# Patient Record
Sex: Female | Born: 1965
Health system: Southern US, Community
[De-identification: ages and names within clinical notes are randomized; demographics above are authoritative.]

## PROBLEM LIST (undated history)

## (undated) DIAGNOSIS — E7800 Pure hypercholesterolemia, unspecified: Secondary | ICD-10-CM

## (undated) DIAGNOSIS — T8859XA Other complications of anesthesia, initial encounter: Secondary | ICD-10-CM

## (undated) DIAGNOSIS — T4145XA Adverse effect of unspecified anesthetic, initial encounter: Secondary | ICD-10-CM

## (undated) HISTORY — PX: OTHER SURGICAL HISTORY: SHX169

---

## 2008-07-07 ENCOUNTER — Ambulatory Visit: Payer: Self-pay | Admitting: Occupational Medicine

## 2008-10-22 ENCOUNTER — Ambulatory Visit: Payer: Self-pay | Admitting: Internal Medicine

## 2008-11-07 ENCOUNTER — Ambulatory Visit: Payer: Self-pay | Admitting: Internal Medicine

## 2008-12-06 ENCOUNTER — Ambulatory Visit: Payer: Self-pay | Admitting: Internal Medicine

## 2009-09-19 ENCOUNTER — Ambulatory Visit: Payer: Self-pay | Admitting: Family Medicine

## 2009-09-19 DIAGNOSIS — J069 Acute upper respiratory infection, unspecified: Secondary | ICD-10-CM | POA: Insufficient documentation

## 2010-06-09 NOTE — Assessment & Plan Note (Signed)
Summary: POSSIBLE SINUS INFECTION   Vital Signs:  Patient Profile:   45 Years Old Female CC:      sinus pressure, ear pressure, hoarsness X 3 days Height:     68 inches Weight:      163 pounds O2 Sat:      98 % O2 treatment:    Room Air Temp:     98.8 degrees F oral Pulse rate:   88 / minute Pulse rhythm:   regular Resp:     14 per minute BP sitting:   121 / 71  (right arm) Cuff size:   regular  Pt. in pain?   no  Vitals Entered By: Lajean Saver RN (Sep 19, 2009 9:35 AM)                   Updated Prior Medication List: * BIRTH CONTROL once a day DAILY MULTIPLE VITAMINS  TABS (MULTIPLE VITAMIN) once daily  Current Allergies (reviewed today): ! * TETROCYCLINE ! SEPTRA ! CIPRO ! * RICOTTA CHEESE ! * VANILLAHistory of Present Illness Chief Complaint: sinus pressure, ear pressure, hoarsness X 3 days History of Present Illness: Subjective: Patient complains of sore throat 2 days ago, resolved.  She has had soreness in her ears for 4 days.  She has a history of seasonal allergies, taking Zyrtec D. No cough No pleuritic pain No wheezing + nasal congestion No post-nasal drainage No sinus pain/pressure No itchy/red eyes ? earache No hemoptysis No SOB No fever/chills No nausea No vomiting No abdominal pain No diarrhea No skin rashes + fatigue No myalgias + headache Used OTC meds without relief   REVIEW OF SYSTEMS Constitutional Symptoms      Denies fever, chills, night sweats, weight loss, weight gain, and fatigue.  Eyes       Denies change in vision, eye pain, eye discharge, glasses, contact lenses, and eye surgery. Ear/Nose/Throat/Mouth       Complains of ear pain, sinus problems, and hoarseness.      Denies hearing loss/aids, change in hearing, ear discharge, dizziness, frequent runny nose, frequent nose bleeds, sore throat, and tooth pain or bleeding.      Comments: bilateral ear pressure Respiratory       Denies dry cough, productive cough, wheezing,  shortness of breath, asthma, bronchitis, and emphysema/COPD.  Cardiovascular       Denies murmurs, chest pain, and tires easily with exhertion.    Gastrointestinal       Denies stomach pain, nausea/vomiting, diarrhea, constipation, blood in bowel movements, and indigestion. Genitourniary       Denies painful urination, kidney stones, and loss of urinary control. Neurological       Denies paralysis, seizures, and fainting/blackouts. Musculoskeletal       Denies muscle pain, joint pain, joint stiffness, decreased range of motion, redness, swelling, muscle weakness, and gout.  Skin       Denies bruising, unusual mles/lumps or sores, and hair/skin or nail changes.  Psych       Denies mood changes, temper/anger issues, anxiety/stress, speech problems, depression, and sleep problems. Other Comments: X 3 days. Ptient had sore throat  1 day, resolved   Past History:  Past Medical History: endometriosis  Past Surgical History: Carpal Boss excision 1995  Family History: Reviewed history from 07/07/2008 and no changes required. mother alive - hypertension, breast cancer, high cholesterol, gout, diabetes father alive - unknown  Social History: Reviewed history from 07/07/2008 and no changes required. denies smoking, drinking or recreational drug  use   Objective:  No acute distress  Eyes:  Pupils are equal, round, and reactive to light and accomdation.  Extraocular movement is intact.  Conjunctivae are not inflamed.  Ears:  Canals normal.  Tympanic membranes normal.   Nose:  Normal septum.  Normal turbinates, mildly congested.   No sinus tenderness present.  Pharynx:  Normal  Neck:  Supple.  No adenopathy is present.  No thyromegaly is present  Lungs:  Clear to auscultation.  Breath sounds are equal.  Heart:  Regular rate and rhythm without murmurs, rubs, or gallops.  Abdomen:  Nontender without masses or hepatosplenomegaly.  Bowel sounds are present.  No CVA or flank tenderness.    Skin:  No rash Assessment New Problems: URI (ICD-465.9)  SUSPECT EARLY VIRAL URI  Plan New Medications/Changes: PREDNISONE 10 MG TABS (PREDNISONE) 2 PO BID for 2 days, then 1 BID for 2 days, then 1 daily for 2 days.  Take PC  #14 x 0, 09/19/2009, Donna Christen MD  New Orders: Est. Patient Level III [phone] Planning Comments:   Begin tapering course of prednisone, expectorant/decongestant, cough suppressant at bedtime if cough worsens. Follow-up with PCP if not improving.   The patient and/or caregiver has been counseled thoroughly with regard to medications prescribed including dosage, schedule, interactions, rationale for use, and possible side effects and they verbalize understanding.  Diagnoses and expected course of recovery discussed and will return if not improved as expected or if the condition worsens. Patient and/or caregiver verbalized understanding.  Prescriptions: PREDNISONE 10 MG TABS (PREDNISONE) 2 PO BID for 2 days, then 1 BID for 2 days, then 1 daily for 2 days.  Take PC  #14 x 0   Entered and Authorized by:   Donna Christen MD   Signed by:   Donna Christen MD on 09/19/2009   Method used:   Print then Give to Patient   RxID:   [id number]   Patient Instructions: 1)  May use Mucinex D (guaifenesin with decongestant) twice daily for congestion. 2)  Increase fluid intake, rest. 3)  Stop Zyrtec D for now. 4)  May use Afrin nasal spray (or generic oxymetazoline) twice daily for about 5 days.  Also recommend using saline nasal spray several times daily and/or saline nasal irrigation. 5)  If cough develops, may take Delsym cough suppressant at bedtime. 6)  Followup with family doctor if not improving 7 to 10 days.

## 2010-11-19 ENCOUNTER — Other Ambulatory Visit (HOSPITAL_COMMUNITY): Payer: Self-pay | Admitting: Obstetrics and Gynecology

## 2010-11-19 DIAGNOSIS — D259 Leiomyoma of uterus, unspecified: Secondary | ICD-10-CM

## 2010-11-20 ENCOUNTER — Ambulatory Visit (HOSPITAL_COMMUNITY)
Admission: RE | Admit: 2010-11-20 | Discharge: 2010-11-20 | Disposition: A | Discharge status: Home / Self Care | Payer: 59 | Source: Ambulatory Visit | Attending: Obstetrics and Gynecology | Admitting: Obstetrics and Gynecology

## 2010-11-20 DIAGNOSIS — D259 Leiomyoma of uterus, unspecified: Secondary | ICD-10-CM | POA: Insufficient documentation

## 2010-11-20 DIAGNOSIS — R109 Unspecified abdominal pain: Secondary | ICD-10-CM | POA: Insufficient documentation

## 2010-11-20 MED ORDER — GADOBENATE DIMEGLUMINE 529 MG/ML IV SOLN
15.0000 mL | Freq: Once | INTRAVENOUS | Status: AC | PRN
  Administered 2010-11-20: 15 mL via INTRAVENOUS

## 2010-11-25 ENCOUNTER — Ambulatory Visit (INDEPENDENT_AMBULATORY_CARE_PROVIDER_SITE_OTHER): Payer: 59 | Admitting: Licensed Clinical Social Worker

## 2010-11-25 DIAGNOSIS — F4323 Adjustment disorder with mixed anxiety and depressed mood: Secondary | ICD-10-CM

## 2010-12-03 ENCOUNTER — Ambulatory Visit (INDEPENDENT_AMBULATORY_CARE_PROVIDER_SITE_OTHER): Payer: 59 | Admitting: Licensed Clinical Social Worker

## 2010-12-03 DIAGNOSIS — F4323 Adjustment disorder with mixed anxiety and depressed mood: Secondary | ICD-10-CM

## 2010-12-17 ENCOUNTER — Ambulatory Visit (INDEPENDENT_AMBULATORY_CARE_PROVIDER_SITE_OTHER): Payer: 59 | Admitting: Licensed Clinical Social Worker

## 2010-12-17 DIAGNOSIS — F4323 Adjustment disorder with mixed anxiety and depressed mood: Secondary | ICD-10-CM

## 2010-12-31 ENCOUNTER — Other Ambulatory Visit: Payer: Self-pay | Admitting: Obstetrics and Gynecology

## 2010-12-31 ENCOUNTER — Ambulatory Visit: Payer: 59 | Admitting: Licensed Clinical Social Worker

## 2011-01-05 ENCOUNTER — Other Ambulatory Visit (HOSPITAL_COMMUNITY): Payer: 59

## 2011-01-06 ENCOUNTER — Encounter (HOSPITAL_COMMUNITY)
Admission: RE | Admit: 2011-01-06 | Discharge: 2011-01-06 | Disposition: A | Discharge status: Home / Self Care | Payer: 59 | Source: Ambulatory Visit | Attending: Obstetrics and Gynecology | Admitting: Obstetrics and Gynecology

## 2011-01-06 ENCOUNTER — Encounter (HOSPITAL_COMMUNITY): Payer: Self-pay

## 2011-01-06 HISTORY — DX: Pure hypercholesterolemia, unspecified: E78.00

## 2011-01-06 HISTORY — DX: Other complications of anesthesia, initial encounter: T88.59XA

## 2011-01-06 HISTORY — DX: Adverse effect of unspecified anesthetic, initial encounter: T41.45XA

## 2011-01-06 LAB — CBC
HCT: 39.5 % (ref 36.0–46.0)
Hemoglobin: 13.3 g/dL (ref 12.0–15.0)
MCH: 30.5 pg (ref 26.0–34.0)
MCHC: 33.7 g/dL (ref 30.0–36.0)

## 2011-01-06 LAB — SURGICAL PCR SCREEN: MRSA, PCR: NEGATIVE

## 2011-01-06 NOTE — Patient Instructions (Addendum)
20 ADALIE MAND  01/06/2011   Your procedure is scheduled on:  01/13/11  Enter through the Main Entrance of [facility] at 600 AM.  Pick up the phone at the desk and dial [phone].   Call this number if you have problems the morning of surgery: [phone]   Remember:   Do not eat food:After Midnight.  Do not drink clear liquids: After Midnight.  Take these medicines the morning of surgery with A SIP OF WATER: NA   Do not wear jewelry, make-up or nail polish.  Do not wear lotions, powders, or perfumes. You may wear deodorant.  Do not shave 48 hours prior to surgery.  Do not bring valuables to the hospital.  Contacts, dentures or bridgework may not be worn into surgery.  Leave suitcase in the car. After surgery it may be brought to your room.  For patients admitted to the hospital, checkout time is 11:00 AM the day of discharge.   Patients discharged the day of surgery will not be allowed to drive home.  Name and phone number of your driver: NA  Special Instructions: CHG Shower Use Special Wash: 1/2 bottle night before surgery and 1/2 bottle morning of surgery.   Please read over the following fact sheets that you were given: MRSA Information and Care and Recovery After Surgery

## 2011-01-12 MED ORDER — GENTAMICIN SULFATE 40 MG/ML IJ SOLN
INTRAVENOUS | Status: AC
  Administered 2011-01-13: 100 mL via INTRAVENOUS
  Filled 2011-01-12: qty 2.5

## 2011-01-12 NOTE — H&P (Signed)
45 y.o. yo G0 complains of symptomatic fibroid uterus.  Pt has had prolonged and excessive bleeding, worsening over the last year.  OCPs have not completely helped the bleeding.  Pt wishes to retain fertility.  Past Medical History  Diagnosis Date  . Complication of anesthesia     :hard to wake"  . Elevated cholesterol    Past Surgical History  Procedure Date  . Laparoscopies     x2  . Carpel      carpel boss excision    History   Social History  . Marital Status: Single    Spouse Name: N/A    Number of Children: N/A  . Years of Education: N/A   Occupational History  . Not on file.   Social History Main Topics  . Smoking status: Never Smoker   . Smokeless tobacco: Never Used  . Alcohol Use: No  . Drug Use: No  . Sexually Active:    Other Topics Concern  . Not on file   Social History Narrative  . No narrative on file    No current facility-administered medications on file prior to encounter.   No current outpatient prescriptions on file prior to encounter.    Allergies  Allergen Reactions  . Augmentin (Amoxicillin-Pot Clavulanate) Diarrhea and Nausea And Vomiting  . Ciprofloxacin     REACTION: nausea/vomiting/diarrhea  . Tetracycline Hcl     REACTION: nausea/vomiting/diarrhea    @VITALS2 @  Lungs: clear to ascultation Cor:  RRR Abdomen:  soft, nontender, nondistended. Ex:  no cords, erythema Pelvic:  17 week size, bulky down to cervix.  U/S:  At least 5 fibroids ranging from to 6-7 cm.  EM obscured by posterior fibroid.  RO normal, LO not seen.  MRI:  Confirmed at least 5 major fibroids mapped out, biggest 8 cm.  No other pelvic abnormalities seen.  A few smaller fibroids seen.  A:  45 yo with enlarged symptomatic fibroid uterus.  Also history of endometriosis.     P:  For Robotic assisted myomectomy and possible resection of endometriosis or LOA.   All risks, benefits and alternatives d/w patient and she desires to proceed with procedures .   Patient has undergone a modified bowel prep and will receive preop antibiotics and SCDs during the operation.     Macie Baum A  Roseanna Koplin A

## 2011-01-13 ENCOUNTER — Encounter (HOSPITAL_COMMUNITY)
Admission: RE | Disposition: A | Discharge status: Home / Self Care | Payer: Self-pay | Source: Ambulatory Visit | Attending: Obstetrics and Gynecology

## 2011-01-13 ENCOUNTER — Encounter (HOSPITAL_COMMUNITY): Payer: Self-pay | Admitting: Anesthesiology

## 2011-01-13 ENCOUNTER — Other Ambulatory Visit: Payer: Self-pay | Admitting: Obstetrics and Gynecology

## 2011-01-13 ENCOUNTER — Ambulatory Visit (HOSPITAL_COMMUNITY): Payer: 59 | Admitting: Anesthesiology

## 2011-01-13 ENCOUNTER — Ambulatory Visit (HOSPITAL_COMMUNITY)
Admission: RE | Admit: 2011-01-13 | Discharge: 2011-01-14 | Disposition: A | Discharge status: Home / Self Care | Payer: 59 | Source: Ambulatory Visit | Attending: Obstetrics and Gynecology | Admitting: Obstetrics and Gynecology

## 2011-01-13 ENCOUNTER — Encounter (HOSPITAL_COMMUNITY): Payer: Self-pay | Admitting: *Deleted

## 2011-01-13 DIAGNOSIS — Z01812 Encounter for preprocedural laboratory examination: Secondary | ICD-10-CM | POA: Insufficient documentation

## 2011-01-13 DIAGNOSIS — Z01818 Encounter for other preprocedural examination: Secondary | ICD-10-CM | POA: Insufficient documentation

## 2011-01-13 DIAGNOSIS — Z9889 Other specified postprocedural states: Secondary | ICD-10-CM

## 2011-01-13 DIAGNOSIS — D259 Leiomyoma of uterus, unspecified: Secondary | ICD-10-CM | POA: Insufficient documentation

## 2011-01-13 HISTORY — PX: ROBOT ASSISTED MYOMECTOMY: SHX5142

## 2011-01-13 LAB — HEMOGLOBIN AND HEMATOCRIT, BLOOD: Hemoglobin: 11.7 g/dL — ABNORMAL LOW (ref 12.0–15.0)

## 2011-01-13 SURGERY — ROBOTIC ASSISTED MYOMECTOMY
Anesthesia: General | Wound class: Clean Contaminated

## 2011-01-13 MED ORDER — FENTANYL CITRATE 0.05 MG/ML IJ SOLN
INTRAMUSCULAR | Status: AC
  Filled 2011-01-13: qty 5

## 2011-01-13 MED ORDER — ONDANSETRON HCL 4 MG/2ML IJ SOLN
4.0000 mg | Freq: Four times a day (QID) | INTRAMUSCULAR | Status: DC | PRN
  Filled 2011-01-13: qty 2

## 2011-01-13 MED ORDER — FENTANYL CITRATE 0.05 MG/ML IJ SOLN
INTRAMUSCULAR | Status: AC
  Filled 2011-01-13: qty 2

## 2011-01-13 MED ORDER — HYDROMORPHONE 0.3 MG/ML IV SOLN
INTRAVENOUS | Status: DC
  Administered 2011-01-13: 2.85 mg via INTRAVENOUS
  Administered 2011-01-13: 20:00:00 via INTRAVENOUS
  Administered 2011-01-14: 1.8 mg via INTRAVENOUS
  Administered 2011-01-14: 05:00:00 via INTRAVENOUS
  Administered 2011-01-14: 1.89 via INTRAVENOUS

## 2011-01-13 MED ORDER — PROMETHAZINE HCL 25 MG/ML IJ SOLN
12.5000 mg | Freq: Four times a day (QID) | INTRAMUSCULAR | Status: DC | PRN
  Administered 2011-01-13 – 2011-01-14 (×2): 12.5 mg via INTRAVENOUS
  Filled 2011-01-13 (×2): qty 1

## 2011-01-13 MED ORDER — LIDOCAINE HCL (CARDIAC) 20 MG/ML IV SOLN
INTRAVENOUS | Status: DC | PRN
Start: 2011-01-13 — End: 2011-01-13
  Administered 2011-01-13: 50 mg via INTRAVENOUS

## 2011-01-13 MED ORDER — FENTANYL CITRATE 0.05 MG/ML IJ SOLN
INTRAMUSCULAR | Status: DC | PRN
  Administered 2011-01-13: 50 ug via INTRAVENOUS
  Administered 2011-01-13: 100 ug via INTRAVENOUS
  Administered 2011-01-13: 150 ug via INTRAVENOUS
  Administered 2011-01-13: 200 ug via INTRAVENOUS
  Administered 2011-01-13 (×2): 100 ug via INTRAVENOUS

## 2011-01-13 MED ORDER — SIMVASTATIN 20 MG PO TABS
20.0000 mg | ORAL_TABLET | Freq: Every day | ORAL | Status: DC
  Filled 2011-01-13 (×2): qty 1

## 2011-01-13 MED ORDER — MIDAZOLAM HCL 5 MG/5ML IJ SOLN
INTRAMUSCULAR | Status: DC | PRN
  Administered 2011-01-13: 2 mg via INTRAVENOUS

## 2011-01-13 MED ORDER — LACTATED RINGERS IV SOLN
INTRAVENOUS | Status: DC
  Administered 2011-01-13 (×3): via INTRAVENOUS

## 2011-01-13 MED ORDER — DEXTROSE IN LACTATED RINGERS 5 % IV SOLN
INTRAVENOUS | Status: DC
  Administered 2011-01-13 – 2011-01-14 (×3): via INTRAVENOUS

## 2011-01-13 MED ORDER — ROCURONIUM BROMIDE 50 MG/5ML IV SOLN
INTRAVENOUS | Status: AC
  Filled 2011-01-13: qty 1

## 2011-01-13 MED ORDER — KETOROLAC TROMETHAMINE 30 MG/ML IJ SOLN
INTRAMUSCULAR | Status: AC
  Administered 2011-01-13: 30 mg via INTRAVENOUS
  Filled 2011-01-13: qty 1

## 2011-01-13 MED ORDER — ONDANSETRON HCL 4 MG/2ML IJ SOLN
INTRAMUSCULAR | Status: DC | PRN
  Administered 2011-01-13: 4 mg via INTRAVENOUS

## 2011-01-13 MED ORDER — HYDROMORPHONE 0.3 MG/ML IV SOLN
INTRAVENOUS | Status: AC
  Filled 2011-01-13: qty 25

## 2011-01-13 MED ORDER — ONDANSETRON HCL 4 MG PO TABS: 4.0000 mg | ORAL_TABLET | Freq: Four times a day (QID) | ORAL | Status: DC | PRN

## 2011-01-13 MED ORDER — NALOXONE HCL 0.4 MG/ML IJ SOLN: 0.4000 mg | INTRAMUSCULAR | Status: DC | PRN

## 2011-01-13 MED ORDER — VASOPRESSIN 20 UNIT/ML IJ SOLN
INTRAMUSCULAR | Status: DC | PRN
  Administered 2011-01-13: 43 m[IU] via INTRAVENOUS

## 2011-01-13 MED ORDER — PROPOFOL 10 MG/ML IV EMUL
INTRAVENOUS | Status: DC | PRN
  Administered 2011-01-13: 180 mg via INTRAVENOUS

## 2011-01-13 MED ORDER — DIPHENHYDRAMINE HCL 12.5 MG/5ML PO ELIX: 12.5000 mg | ORAL_SOLUTION | Freq: Four times a day (QID) | ORAL | Status: DC | PRN

## 2011-01-13 MED ORDER — NEOSTIGMINE METHYLSULFATE 1 MG/ML IJ SOLN
INTRAMUSCULAR | Status: DC | PRN
  Administered 2011-01-13: 2 mg via INTRAMUSCULAR

## 2011-01-13 MED ORDER — MIDAZOLAM HCL 2 MG/2ML IJ SOLN
INTRAMUSCULAR | Status: AC
  Filled 2011-01-13: qty 2

## 2011-01-13 MED ORDER — SUMATRIPTAN SUCCINATE 50 MG PO TABS
50.0000 mg | ORAL_TABLET | ORAL | Status: DC | PRN
  Filled 2011-01-13: qty 1

## 2011-01-13 MED ORDER — HYDROMORPHONE HCL 1 MG/ML IJ SOLN
INTRAMUSCULAR | Status: AC
  Filled 2011-01-13: qty 1

## 2011-01-13 MED ORDER — KETOROLAC TROMETHAMINE 30 MG/ML IJ SOLN
30.0000 mg | Freq: Four times a day (QID) | INTRAMUSCULAR | Status: DC
  Administered 2011-01-13: 30 mg via INTRAVENOUS

## 2011-01-13 MED ORDER — OXYCODONE-ACETAMINOPHEN 5-325 MG PO TABS: 1.0000 | ORAL_TABLET | ORAL | Status: DC | PRN

## 2011-01-13 MED ORDER — ROCURONIUM BROMIDE 100 MG/10ML IV SOLN
INTRAVENOUS | Status: DC | PRN
  Administered 2011-01-13 (×2): 20 mg via INTRAVENOUS
  Administered 2011-01-13: 60 mg via INTRAVENOUS
  Administered 2011-01-13: 20 mg via INTRAVENOUS

## 2011-01-13 MED ORDER — DEXTROSE IN LACTATED RINGERS 5 % IV SOLN: INTRAVENOUS | Status: DC

## 2011-01-13 MED ORDER — SODIUM CHLORIDE 0.9 % IJ SOLN: 9.0000 mL | INTRAMUSCULAR | Status: DC | PRN

## 2011-01-13 MED ORDER — LACTATED RINGERS IR SOLN
Status: DC | PRN
  Administered 2011-01-13: 3

## 2011-01-13 MED ORDER — IBUPROFEN 800 MG PO TABS: 800.0000 mg | ORAL_TABLET | Freq: Three times a day (TID) | ORAL | Status: DC | PRN

## 2011-01-13 MED ORDER — KETOROLAC TROMETHAMINE 30 MG/ML IJ SOLN
30.0000 mg | Freq: Four times a day (QID) | INTRAMUSCULAR | Status: DC
  Administered 2011-01-13 – 2011-01-14 (×3): 30 mg via INTRAVENOUS
  Filled 2011-01-13 (×3): qty 1

## 2011-01-13 MED ORDER — ONDANSETRON HCL 4 MG/2ML IJ SOLN
4.0000 mg | Freq: Four times a day (QID) | INTRAMUSCULAR | Status: DC | PRN
  Administered 2011-01-13 – 2011-01-14 (×2): 4 mg via INTRAVENOUS
  Filled 2011-01-13 (×2): qty 2

## 2011-01-13 MED ORDER — DIPHENHYDRAMINE HCL 50 MG/ML IJ SOLN: 12.5000 mg | Freq: Four times a day (QID) | INTRAMUSCULAR | Status: DC | PRN

## 2011-01-13 MED ORDER — HYDROMORPHONE HCL 1 MG/ML IJ SOLN
INTRAMUSCULAR | Status: AC
  Administered 2011-01-13: 0.5 mg via INTRAVENOUS
  Filled 2011-01-13: qty 1

## 2011-01-13 MED ORDER — DEXAMETHASONE SODIUM PHOSPHATE 4 MG/ML IJ SOLN
INTRAMUSCULAR | Status: DC | PRN
  Administered 2011-01-13: 10 mg via INTRAVENOUS

## 2011-01-13 MED ORDER — GLYCOPYRROLATE 0.2 MG/ML IJ SOLN
INTRAMUSCULAR | Status: DC | PRN
  Administered 2011-01-13: .4 mg via INTRAVENOUS

## 2011-01-13 MED ORDER — MENTHOL 3 MG MT LOZG
1.0000 | LOZENGE | OROMUCOSAL | Status: DC | PRN
Start: 2011-01-13 — End: 2011-01-14

## 2011-01-13 MED ORDER — LIDOCAINE HCL (CARDIAC) 20 MG/ML IV SOLN
INTRAVENOUS | Status: AC
  Filled 2011-01-13: qty 5

## 2011-01-13 MED ORDER — PROPOFOL 10 MG/ML IV EMUL
INTRAVENOUS | Status: AC
  Filled 2011-01-13: qty 20

## 2011-01-13 MED ORDER — ZOLPIDEM TARTRATE 5 MG PO TABS: 5.0000 mg | ORAL_TABLET | Freq: Every evening | ORAL | Status: DC | PRN

## 2011-01-13 MED ORDER — HYDROMORPHONE HCL 1 MG/ML IJ SOLN
INTRAMUSCULAR | Status: DC | PRN
  Administered 2011-01-13: 1 mg via INTRAVENOUS
  Administered 2011-01-13: 0.5 mg via INTRAVENOUS

## 2011-01-13 MED ORDER — RINGERS IRRIGATION IR SOLN
Status: DC | PRN
  Administered 2011-01-13: 500 mL

## 2011-01-13 SURGICAL SUPPLY — 67 items
APL SKNCLS STERI-STRIP NONHPOA (GAUZE/BANDAGES/DRESSINGS) ×1
BARRIER ADHS 3X4 INTERCEED (GAUZE/BANDAGES/DRESSINGS) ×2 IMPLANT
BENZOIN TINCTURE PRP APPL 2/3 (GAUZE/BANDAGES/DRESSINGS) ×2 IMPLANT
BLADELESS LONG 8MM (BLADE) ×2 IMPLANT
BRR ADH 4X3 ABS CNTRL BYND (GAUZE/BANDAGES/DRESSINGS) ×1
BRR ADH 6X5 SEPRAFILM 1 SHT (MISCELLANEOUS) ×2
CABLE HIGH FREQUENCY MONO STRZ (ELECTRODE) ×2 IMPLANT
CANISTER SUCTION 2500CC (MISCELLANEOUS) ×3 IMPLANT
CANNULA SEAL DVNC (CANNULA) ×3 IMPLANT
CANNULA SEALS DA VINCI (CANNULA) ×3
CHLORAPREP W/TINT 26ML (MISCELLANEOUS) ×2 IMPLANT
CLOTH BEACON ORANGE TIMEOUT ST (SAFETY) ×2 IMPLANT
CONT PATH 16OZ SNAP LID 3702 (MISCELLANEOUS) ×2 IMPLANT
CORDS BIPOLAR (ELECTRODE) ×1 IMPLANT
COVER MAYO STAND STRL (DRAPES) ×2 IMPLANT
COVER TABLE BACK 60X90 (DRAPES) ×4 IMPLANT
COVER TIP SHEARS 8 DVNC (MISCELLANEOUS) ×1 IMPLANT
COVER TIP SHEARS 8MM DA VINCI (MISCELLANEOUS) ×1
DECANTER SPIKE VIAL GLASS SM (MISCELLANEOUS) ×2 IMPLANT
DERMABOND ADVANCED (GAUZE/BANDAGES/DRESSINGS) ×2 IMPLANT
DRAPE HUG U DISPOSABLE (DRAPE) ×2 IMPLANT
DRAPE HYSTEROSCOPY (DRAPE) ×2 IMPLANT
DRAPE LG THREE QUARTER DISP (DRAPES) ×4 IMPLANT
DRAPE MONITOR DA VINCI (DRAPE) ×2 IMPLANT
DRAPE WARM FLUID 44X44 (DRAPE) ×2 IMPLANT
ELECT REM PT RETURN 9FT ADLT (ELECTROSURGICAL) ×2
ELECTRODE REM PT RTRN 9FT ADLT (ELECTROSURGICAL) ×1 IMPLANT
EVACUATOR SMOKE 8.L (FILTER) ×2 IMPLANT
GAUZE VASELINE 3X9 (GAUZE/BANDAGES/DRESSINGS) IMPLANT
GLOVE BIO SURGEON STRL SZ7 (GLOVE) ×6 IMPLANT
GLOVE ECLIPSE 6.5 STRL STRAW (GLOVE) ×6 IMPLANT
GOWN PREVENTION PLUS LG XLONG (DISPOSABLE) ×8 IMPLANT
GYRUS RUMI II 2.5CM BLUE (DISPOSABLE) ×2
KIT DISP ACCESSORY 4 ARM (KITS) ×2 IMPLANT
MANIPULATOR UTERINE 4.5 ZUMI (MISCELLANEOUS) IMPLANT
NDL INSUFFLATION 14GA 120MM (NEEDLE) ×1 IMPLANT
NEEDLE INSUFFLATION 14GA 120MM (NEEDLE) ×2 IMPLANT
NS IRRIG 1000ML POUR BTL (IV SOLUTION) ×6 IMPLANT
OCCLUDER COLPOPNEUMO (BALLOONS) ×4 IMPLANT
PACK LAVH (CUSTOM PROCEDURE TRAY) ×2 IMPLANT
POSITIONER SURGICAL ARM (MISCELLANEOUS) ×4 IMPLANT
RUMI II GYRUS 2.5CM BLUE (DISPOSABLE) IMPLANT
SEPRAFILM MEMBRANE 5X6 (MISCELLANEOUS) ×2 IMPLANT
SET IRRIG TUBING LAPAROSCOPIC (IRRIGATION / IRRIGATOR) ×3 IMPLANT
SOLUTION ELECTROLUBE (MISCELLANEOUS) ×2 IMPLANT
SPONGE LAP 18X18 X RAY DECT (DISPOSABLE) IMPLANT
STRIP CLOSURE SKIN 1/2X4 (GAUZE/BANDAGES/DRESSINGS) ×2 IMPLANT
SUT VIC AB 0 CT1 27 (SUTURE) ×10
SUT VIC AB 0 CT1 27XBRD ANBCTR (SUTURE) ×5 IMPLANT
SUT VIC AB 2-0 CT2 27 (SUTURE) ×4 IMPLANT
SUT VICRYL 0 UR6 27IN ABS (SUTURE) ×4 IMPLANT
SUT VICRYL RAPIDE 3 0 (SUTURE) ×4 IMPLANT
SYR 50ML LL SCALE MARK (SYRINGE) ×2 IMPLANT
SYSTEM CONVERTIBLE TROCAR (TROCAR) IMPLANT
TIP RUMI ORANGE 6.7MMX12CM (TIP) ×1 IMPLANT
TIP UTERINE 5.1X6CM LAV DISP (MISCELLANEOUS) IMPLANT
TIP UTERINE 6.7X10CM GRN DISP (MISCELLANEOUS) IMPLANT
TIP UTERINE 6.7X6CM WHT DISP (MISCELLANEOUS) IMPLANT
TIP UTERINE 6.7X8CM BLUE DISP (MISCELLANEOUS) IMPLANT
TOWEL OR 17X24 6PK STRL BLUE (TOWEL DISPOSABLE) ×4 IMPLANT
TRAY FOLEY BAG SILVER LF 14FR (CATHETERS) ×2 IMPLANT
TROCAR DISP BLADELESS 8 DVNC (TROCAR) ×1 IMPLANT
TROCAR DISP BLADELESS 8MM (TROCAR) ×1
TROCAR XCEL 12X100 BLDLESS (ENDOMECHANICALS) ×2 IMPLANT
TROCAR Z-THREAD 12X150 (TROCAR) IMPLANT
TROCAR Z-THREAD BLADED 12X100M (TROCAR) IMPLANT
TUBING FILTER THERMOFLATOR (ELECTROSURGICAL) ×2 IMPLANT

## 2011-01-13 NOTE — Op Note (Signed)
01/13/2011  1:52 PM  PATIENT:  Sarah Mendez  45 y.o. female  PRE-OPERATIVE DIAGNOSIS:  Fibroids  POST-OPERATIVE DIAGNOSIS:  Fibroids  PROCEDURE:  Procedure(s): ROBOTIC ASSISTED MYOMECTOMY  SURGEON:  Surgeon(s): Artis Flock  PHYSICIAN ASSISTANT:   ASSISTANTS: Dr.  Duane Lope   ANESTHESIA:   general  ESTIMATED BLOOD LOSS: 800cc  IVF:  2500 cc  UOP:  350 cc  BLOOD ADMINISTERED:none  DRAINS: none   MEDICATIONS USED:   Pitressin, Seprafilm  SPECIMEN:  Source of Specimen:  6 myomas, sent as morcellated specimen,  peritoneal bx  DISPOSITION OF SPECIMEN:  PATHOLOGY  COUNTS:  YES  PATIENT DISPOSITION:  PACU - hemodynamically stable.   Delay start of Pharmacological VTE agent (>24hrs) due to surgical blood loss or risk of bleeding:  not applicable  Complications:  On insertion of first needle of pitressin, a vessel was interupted in the abdominal wall.  An anterior abdominal wall hematoma developed which was stable after a figure of eight stitch was used for hemostasis.  A small loop of bowel was caught in the morcellator when the blade was introduced but it was reduced without injury to the bowel.  Findings:  Five large myomas in the areas indicated on the MRI.  An additional 2 cm fibroid was removed as well.  Endometriosis was seen in the cul de sac as well as a large plaque under the left ovary; this plaque was removed.  Technique:  After adequate general anesthesia was achieved the patient was prepped positioned and draped in the usual sterile fashion. The uterine cavity measured 13 cm and the 12 mm Rumi was selected. This was then introduced with Rumi instrument and secured with the balloon with the 2.5 Koh ring up against the fornix of the vagina. The Foley was then introduced and attention was turned to the abdomen. A 2 cm incision was made about 10 cm above the umbilicus horizontally and each layer including the fashion was then tented up. The fascia  was entered into with the scalpel and the corners of the fascia were then secured with 2 stitches of Vicryl. The peritoneum was then entered into bluntly with my finger and the 12 mm trocar was then introduced bluntly and secured down with the 2 stitches. The abdomen was insufflated and the robotic scope was placed inside the abdomen. The 8.5 mm trochars were then placed under direct visualization of the camera, 2 10 cm on either side of the camera port and one 3 cm above the iliac crest on the right-hand side. A 5 mm trocar assistant port was then placed under direct visualization opposite the lower trocar on the right side.  The Robot was then docked.  The PK forceps were introduced on arm 2, the hot shears on arm one, and the single-tooth tenaculum on arm 3, all under direct visualization the camera. At this point I broke scrub and sat at the console. I was able to identify the 2 major posterior fibroids at the of the top of the fundus of the uterus and each of these was injected with Pitressin. Unfortunately at this point when the spinal needle was introduced through the abdominal wall indicate a small pumping bleeder. I was able to stop the bleeding with the PK forceps from the inside however there appeared to be a hematoma at that extended along the rectus sheath of the left hand side up to the umbilicus and as wide as the inguinal canal. I then re\re scrubbed  and then made a small skin incision above where the first insertion had been and did a figure-of-eight stitch into the fascia as far as the low as I could not through the peritoneum. This seemed to help stop the expansion of the bruising underneath the rectus and above the peritoneum and I broke scrub again and sat at the console.  I was then able to remove the 2 fundal posterior fibroids with sharp and blunt dissection with the PK forceps and the hot shears. The bed of each of the fibroids that was vascular was taken care of with the PK forceps. The  instruments were then removed and replaced with a fenestrated bipolar and 3 and 2 large needle drivers arm 1 and 2. At this point I began the closure of both of these myometriums with the lock suture, 2-0 on a GS needle. Each closure started deep and then double back on itself and then did a third layer of serosa. The stitch was secured via doubling back a fourth time. Attention was then turned to the anterior uterus where more Pitressin was injected over 2 fibroids and the same procedure was undertaken again with blunt and sharp sharp dissection to remove one large fibroid and one smaller fibroid. The myometrium was then closed again with the V. LOC in 3 layers. The uterus was flipped back up and attention was turned to the posterior where in Pitressin was then again injected and 2 more fibroids were removed in the same fashion. The 3 smaller fibroids were tethered with a 0 Vicryl. The posterior fundus was then closed again with the V. loc in 3 layers. Hemostasis was achieved with some bipolar cautery. The original fundal incisions were still oozing and cautery with the hot shears was undertaken to achieve hemostasis however the edges of the serosa were still bleeding. A 3-0 V.loc was then used to close the both serosal edges and hemostasis is achieved. Irrigation had been performed several times and hemostasis was achieved at this point. Chromopertubation was done several times during the procedure and no entry into the cavity was ever seen.  Attention was then turned to the small plaque of endometriosis under the left ovary this plaque was well away from the ureter and it was tented up and incised with hot shears carefully and excised and sent to pathology. Hemostasis this area was good. There were multiple endometriotic implants in the cul-de-sac and each of these was carefully cauterized with the hot shears between the 2 uterosacral ligaments. All instruments removed at this point and the robot undocked. I  scrubbed back in.   The largest fibroid was identified and the Storz morcellator introduced into the abdomen through the 12 mm port. The largest fiber was able to be morcellated and removed entirely. The 3 smaller fibroids that were on the Vicryl suture were able to be identified and or also morcellated with the Storz morcellator. These were removed entirely. The 8 needles were removed from the abdomen through the 12 mm port and the last 2 fibroids were searched for. Although ultrasound was called in to help [place] we wound up not needing them as we found one fibroid on the left-hand side underneath the bowel and the final fibroid above the liver and each on the right-hand side. Both of these fibroids were able to be moved back down to the cul-de-sac and then were morcellated with the Storz morcellator through the 12 mm port. It was at this point that the bowel had been  hung up on these Storz morcellator and the plate had been introduced but the blade had not been turned on. The machine had been turned off and we carefully and bluntly were able to tease the bowel out of the trocar and checked it and found it to be completely normal and intact. the morcellator had been turned back on and the morcellation had continued of the 2 additional fibroids. Once all the fibroids had been removed and the uterine incision found to be hemostatic except for a very small amount of oozing on one of the serosal edges the morcellator was removed and the trocar reintroduced and the Seprafilm slurry port over all surfaces of the uterus. All instruments and trochars were removed from the abdomen and the abdomen desufflated. The scrub tech was able to remove the vaginal instruments. the 12 now 15 mm fascial site where the camera port had been was  Sewn with a running stitch of 2-0 Vicryl until it was intact. All skin incisions were then able to be closed with 3-0 Vicryl Rapide in a subcuticular fashion including the small skin incision had  been made in order to do the figure-of-eight for hemostasis. Dermabond was placed on all of the incisions. The patient tolerated the procedure well and was returned to recovery room in stable condition. A hemoglobin will be done in the recovery room.

## 2011-01-13 NOTE — Progress Notes (Signed)
Patient had nausea but Phenergan has helped.  No vomitting.  Pain control is fair with PCA.  Some dizziness with sitting up.    BP 134/74  Pulse 87  Temp(Src) 98 F (36.7 C) (Oral)  Resp 18  SpO2 98%  lungs:   clear to auscultation cor:    RRR Abdomen:  Tympanic but soft, appropriate tenderness, incisions intact and without erythema or exudate.  + Bowel sounds. ex:    no cords   Lab Results  Component Value Date   WBC 8.6 01/06/2011   HGB 11.7* 01/13/2011   HCT 35.3* 01/13/2011   MCV 90.6 01/06/2011   PLT 271 01/06/2011    A/P  Post op myomectomy with abdominal wall hematoma.  Hgb stable in PACU but expect it to be lower tomorrow.  Pt has had some extra nausea that is now better with phenergan.  Pain is expected from hematoma and is controlled with PCA.  Pt desires some applesauce.  Nicie Milan A

## 2011-01-13 NOTE — Anesthesia Preprocedure Evaluation (Signed)
Anesthesia Evaluation  Name, MR# and DOB Patient awake  General Assessment Comment  Reviewed: Allergy & Precautions, H&P , Patient's Chart, lab work & pertinent test results, reviewed documented beta blocker date and time   History of Anesthesia Complications (+) PROLONGED INTUBATION  Airway Mallampati: I TM Distance: >3 FB Neck ROM: full    Dental  (+) Teeth Intact   Pulmonary  clear to auscultation  pulmonary exam normalPulmonary Exam Normal breath sounds clear to auscultation none    Cardiovascular     Neuro/Psych Negative Neurological ROS  Negative Psych ROS  GI/Hepatic/Renal negative GI ROS  negative Liver ROS  negative Renal ROS        Endo/Other  Negative Endocrine ROS (+)      Abdominal Normal abdominal exam  (+)   Musculoskeletal negative musculoskeletal ROS (+)   Hematology negative hematology ROS (+)   Peds  Reproductive/Obstetrics negative OB ROS    Anesthesia Other Findings             Anesthesia Physical Anesthesia Plan  ASA: I  Anesthesia Plan: General   Post-op Pain Management:    Induction: Intravenous  Airway Management Planned: Oral ETT  Additional Equipment:   Intra-op Plan:   Post-operative Plan: Extubation in OR  Informed Consent: I have reviewed the patients History and Physical, chart, labs and discussed the procedure including the risks, benefits and alternatives for the proposed anesthesia with the patient or authorized representative who has indicated his/her understanding and acceptance.   Dental Advisory Given  Plan Discussed with: CRNA  Anesthesia Plan Comments:         Anesthesia Quick Evaluation

## 2011-01-13 NOTE — Anesthesia Procedure Notes (Addendum)
Procedure Name: Intubation Date/Time: 01/13/2011 7:25 AM Performed by: Madison Hickman Pre-anesthesia Checklist: Patient identified, Emergency Drugs available, Suction available, Patient being monitored and Timeout performed Patient Re-evaluated:Patient Re-evaluated prior to inductionOxygen Delivery Method: Circle System Utilized Preoxygenation: Pre-oxygenation with 100% oxygen Intubation Type: IV induction Ventilation: Mask ventilation without difficulty Laryngoscope Size: Mac and 3 Grade View: Grade II Tube type: Oral Tube size: 7.0 mm Number of attempts: 1 Airway Equipment and Method: stylet Placement Confirmation: ETT inserted through vocal cords under direct vision,  positive ETCO2 and breath sounds checked- equal and bilateral Secured at: 21 cm Tube secured with: Tape Dental Injury: Teeth and Oropharynx as per pre-operative assessment

## 2011-01-13 NOTE — Anesthesia Postprocedure Evaluation (Signed)
Anesthesia Post Note  Patient: Sarah Mendez  Procedure(s) Performed:  ROBOTIC ASSISTED MYOMECTOMY - with Chromopertubation;   Anesthesia type: General  Patient location: PACU  Post pain: Pain level controlled  Post assessment: Post-op Vital signs reviewed  Last Vitals:  Filed Vitals:   01/13/11 1445  BP: 130/72  Pulse: 79  Temp:   Resp: 18    Post vital signs: Reviewed  Level of consciousness: sedated  Complications: No apparent anesthesia complications

## 2011-01-13 NOTE — Transfer of Care (Signed)
Immediate Anesthesia Transfer of Care Note  Patient: Sarah Mendez  Procedure(s) Performed:  ROBOTIC ASSISTED MYOMECTOMY - with Chromopertubation;   Patient Location: PACU  Anesthesia Type:   Level of Consciousness: awake, alert  and oriented  Airway & Oxygen Therapy: Patient Spontanous Breathing and Patient connected to nasal cannula oxygen  Post-op Assessment: Report given to PACU RN and Post -op Vital signs reviewed and stable  Post vital signs: Reviewed and stable  Complications: No apparent anesthesia complications

## 2011-01-13 NOTE — Brief Op Note (Addendum)
01/13/2011  1:52 PM  PATIENT:  Sarah Mendez  45 y.o. female  PRE-OPERATIVE DIAGNOSIS:  Fibroids  POST-OPERATIVE DIAGNOSIS:  Fibroids  PROCEDURE:  Procedure(s): ROBOTIC ASSISTED MYOMECTOMY  SURGEON:  Surgeon(s): Artis Flock  PHYSICIAN ASSISTANT:   ASSISTANTS: Dr.  Duane Lope   ANESTHESIA:   general  ESTIMATED BLOOD LOSS: 800cc  IVF:  2500 cc  UOP:  350 cc  BLOOD ADMINISTERED:none  DRAINS: none   MEDICATIONS USED:   Pitressin, Seprafilm  SPECIMEN:  Source of Specimen:  6 myomas, sent as morcellated specimen,  peritoneal bx  DISPOSITION OF SPECIMEN:  PATHOLOGY  COUNTS:  YES  PATIENT DISPOSITION:  PACU - hemodynamically stable.   Delay start of Pharmacological VTE agent (>24hrs) due to surgical blood loss or risk of bleeding:  not applicable  Complications:  On insertion of first needle of pitressin, a vessel was interupted in the abdominal wall.  An anterior abdominal wall hematoma developed which was stable after a figure of eight stitch was used for hemostasis.  A small loop of bowel was caught in the morcellator when the blade was introduced but it was reduced without injury to the bowel.  Findings:  Five large myomas in the areas indicated on the MRI.  An additional 2 cm fibroid was removed as well.  Endometriosis was seen in the cul de sac as well as a large plaque under the left ovary; this plaque was removed.  Technique:  After adequate general anesthesia was achieved the patient was prepped positioned and draped in the usual sterile fashion. The uterine cavity measured 13 cm and the 12 mm Rumi was selected. This was then introduced with Rumi instrument and secured with the balloon with the 2.5 Koh ring up against the fornix of the vagina. The Foley was then introduced and attention was turned to the abdomen. A 2 cm incision was made about 10 cm above the umbilicus horizontally and each layer including the fashion was then tented up. The fascia  was entered into with the scalpel and the corners of the fascia were then secured with 2 stitches of Vicryl. The peritoneum was then entered into bluntly with my finger and the 12 mm trocar was then introduced bluntly and secured down with the 2 stitches. The abdomen was insufflated and the robotic scope was placed inside the abdomen. The 8.5 mm trochars were then placed under direct visualization of the camera, 2 10 cm on either side of the camera port and one 3 cm above the iliac crest on the right-hand side. A 5 mm trocar assistant port was then placed under direct visualization opposite the lower trocar on the right side.  The Robot was then docked.  The PK forceps were introduced on arm 2, the hot shears on arm one, and the single-tooth tenaculum on arm 3, all under direct visualization the camera. At this point I broke scrub and sat at the console. I was able to identify the 2 major posterior fibroids at the of the top of the fundus of the uterus and each of these was injected with Pitressin. Unfortunately at this point when the spinal needle was introduced through the abdominal wall indicate a small pumping bleeder. I was able to stop the bleeding with the PK forceps from the inside however there appeared to be a hematoma at that extended along the rectus sheath of the left hand side up to the umbilicus and as wide as the inguinal canal. I then re\re scrubbed  and then made a small skin incision above where the first insertion had been and did a figure-of-eight stitch into the fascia as far as the low as I could not through the peritoneum. This seemed to help stop the expansion of the bruising underneath the rectus and above the peritoneum and I broke scrub again and sat at the console.  I was then able to remove the 2 fundal posterior fibroids with sharp and blunt dissection with the PK forceps and the hot shears. The bed of each of the fibroids that was vascular was taken care of with the PK forceps. The  instruments were then removed and replaced with a fenestrated bipolar and 3 and 2 large needle drivers arm 1 and 2. At this point I began the closure of both of these myometriums with the lock suture, 2-0 on a GS needle. Each closure started deep and then double back on itself and then did a third layer of serosa. The stitch was secured via doubling back a fourth time. Attention was then turned to the anterior uterus where more Pitressin was injected over 2 fibroids and the same procedure was undertaken again with blunt and sharp sharp dissection to remove one large fibroid and one smaller fibroid. The myometrium was then closed again with the V. LOC in 3 layers. The uterus was flipped back up and attention was turned to the posterior where in Pitressin was then again injected and 2 more fibroids were removed in the same fashion. The 3 smaller fibroids were tethered with a 0 Vicryl. The posterior fundus was then closed again with the V. loc in 3 layers. Hemostasis was achieved with some bipolar cautery. The original fundal incisions were still oozing and cautery with the hot shears was undertaken to achieve hemostasis however the edges of the serosa were still bleeding. A 3-0 V.loc was then used to close the both serosal edges and hemostasis is achieved. Irrigation had been performed several times and hemostasis was achieved at this point. Chromopertubation was done several times during the procedure and no entry into the cavity was ever seen.  Attention was then turned to the small plaque of endometriosis under the left ovary this plaque was well away from the ureter and it was tented up and incised with hot shears carefully and excised and sent to pathology. Hemostasis this area was good. There were multiple endometriotic implants in the cul-de-sac and each of these was carefully cauterized with the hot shears between the 2 uterosacral ligaments. All instruments removed at this point and the robot undocked. I  scrubbed back in.   The largest fibroid was identified and the Storz morcellator introduced into the abdomen through the 12 mm port. The largest fiber was able to be morcellated and removed entirely. The 3 smaller fibroids that were on the Vicryl suture were able to be identified and or also morcellated with the Storz morcellator. These were removed entirely. The 8 needles were removed from the abdomen through the 12 mm port and the last 2 fibroids were searched for. Although ultrasound was called in to help [place] we wound up not needing them as we found one fibroid on the left-hand side underneath the bowel and the final fibroid above the liver and each on the right-hand side. Both of these fibroids were able to be moved back down to the cul-de-sac and then were morcellated with the Storz morcellator through the 12 mm port. It was at this point that the bowel had been  hung up on these Storz morcellator and the plate had been introduced but the blade had not been turned on. The machine had been turned off and we carefully and bluntly were able to tease the bowel out of the trocar and checked it and found it to be completely normal and intact. the morcellator had been turned back on and the morcellation had continued of the 2 additional fibroids. Once all the fibroids had been removed and the uterine incision found to be hemostatic except for a very small amount of oozing on one of the serosal edges the morcellator was removed and the trocar reintroduced and the Seprafilm slurry port over all surfaces of the uterus. All instruments and trochars were removed from the abdomen and the abdomen desufflated. The scrub tech was able to remove the vaginal instruments. the 12 now 15 mm fascial site where the camera port had been was  Sewn with a running stitch of 2-0 Vicryl until it was intact. All skin incisions were then able to be closed with 3-0 Vicryl Rapide in a subcuticular fashion including the small skin incision had  been made in order to do the figure-of-eight for hemostasis. Dermabond was placed on all of the incisions. The patient tolerated the procedure well and was returned to recovery room in stable condition. A hemoglobin will be done in the recovery room.    Davontae Prusinski A

## 2011-01-13 NOTE — Progress Notes (Signed)
There has been no change in the patients history or status since the history and physical.  Filed Vitals:   01/13/11 0609  BP: 123/67  Pulse: 70  Temp: 98.2 F (36.8 C)  TempSrc: Oral  Resp: 18  SpO2: 100%    Lab Results  Component Value Date   WBC 8.6 01/06/2011   HGB 13.3 01/06/2011   HCT 39.5 01/06/2011   MCV 90.6 01/06/2011   PLT 271 01/06/2011    Sarah Mendez

## 2011-01-14 LAB — CBC
HCT: 31.4 % — ABNORMAL LOW (ref 36.0–46.0)
Hemoglobin: 10.6 g/dL — ABNORMAL LOW (ref 12.0–15.0)
MCH: 30.2 pg (ref 26.0–34.0)
MCV: 89.5 fL (ref 78.0–100.0)
Platelets: 252 10*3/uL (ref 150–400)
RBC: 3.51 MIL/uL — ABNORMAL LOW (ref 3.87–5.11)
WBC: 12.3 10*3/uL — ABNORMAL HIGH (ref 4.0–10.5)

## 2011-01-14 MED ORDER — HYDROMORPHONE 0.3 MG/ML IV SOLN
INTRAVENOUS | Status: AC
  Filled 2011-01-14: qty 25

## 2011-01-14 MED ORDER — HYDROCODONE-ACETAMINOPHEN 5-325 MG PO TABS
2.0000 | ORAL_TABLET | Freq: Four times a day (QID) | ORAL | Status: DC | PRN
  Administered 2011-01-14 (×2): 1 via ORAL
  Filled 2011-01-14 (×2): qty 1

## 2011-01-14 NOTE — Progress Notes (Signed)
Encounter addended by: Truitt Leep, CRNA on: 01/14/2011  8:21 AM<BR>     Documentation filed: Notes Section

## 2011-01-14 NOTE — Anesthesia Postprocedure Evaluation (Signed)
  Anesthesia Post-op Note  Patient: Sarah Mendez  Procedure(s) Performed:  ROBOTIC ASSISTED MYOMECTOMY - with Chromopertubation;   Patient Location: PAC[facility]  Anesthesia Type: General  Level of Consciousness: awake, alert , oriented and patient cooperative  Airway and Oxygen Therapy: Patient Spontanous Breathing  Post-op Pain: moderate  Post-op Assessment: Patient's Cardiovascular Status Stable, Respiratory Function Stable and Pain level controlled. Nausea no vomiting currently. RNs giving antiemetics and stopping PCA this AM , no vomiting.   Post-op Vital Signs: stable  Complications: No apparent anesthesia complications

## 2011-01-14 NOTE — Discharge Summary (Signed)
Physician Discharge Summary  Patient ID: Sarah Mendez MRN: [id number] DOB/AGE: March 30, 1966 45 y.o.  Admit date: 01/13/2011 Discharge date: 01/14/2011  Admission Diagnoses:symptomatic fibroid uterus  Discharge Diagnoses: same Active Problems:  * No active hospital problems. *    Discharged Condition: good  Hospital Course: uncomplicated  Consults: none  Significant Diagnostic Studies: labs:  Lab Results  Component Value Date   WBC 12.3* 01/14/2011   HGB 10.6* 01/14/2011   HCT 31.4* 01/14/2011   MCV 89.5 01/14/2011   PLT 252 01/14/2011     Treatments: surgery: Robotic myomectomy.  Discharge Exam: Blood pressure 117/75, pulse 78, temperature 97.9 F (36.6 C), temperature source Oral, resp. rate 16, height 5\' 8"  (1.727 m), weight 74.844 kg (165 lb), SpO2 99.00%.  Disposition: Final discharge disposition not confirmed  Discharge Orders    Future Orders Please Complete By Expires   Diet - low sodium heart healthy      Discharge instructions      Comments:   No driving on narcotics, no sexual activity for 2 weeks.   Increase activity slowly      May shower / Bathe      Comments:   Shower, no bath for 2 weeks.   Sexual Activity Restrictions      Comments:   No sexual activity for 2 weeks.   Remove dressing in 24 hours      Call MD for:  temperature >100.4        Current Discharge Medication List    CONTINUE these medications which have NOT CHANGED   Details  Ferrous Sulfate (IRON) 325 (65 FE) MG TABS Take 1 tablet by mouth daily.      Multiple Vitamins-Minerals (MULTIVITAMIN WITH MINERALS) tablet Take 1 tablet by mouth daily.      rizatriptan (MAXALT) 5 MG tablet Take 5 mg by mouth as needed. May repeat in 2 hours if needed     simvastatin (ZOCOR) 20 MG tablet Take 20 mg by mouth at bedtime.        STOP taking these medications     norethindrone-ethinyl estradiol-iron (ESTROSTEP FE,TILIA FE,TRI-LEGEST FE) 1-20/1-30/1-35 MG-MCG tablet      traMADol (ULTRAM) 50 MG  tablet        Follow-up Information    Follow up with Mandel Seiden A. Call in 1 day.   Contact information:   [address] [place] [address] [phone]          Signed: Loney Laurence 01/14/2011, 4:28 PM

## 2011-01-14 NOTE — Progress Notes (Signed)
Patient tolerated applesauce and soup last night.  Still has some nausea but no vomitting and tol PO.  Ambulated once last night. Not voiding-foley still in.  Pain control is good.  BP 123/66  Pulse 98  Temp(Src) 98 F (36.7 C) (Oral)  Resp 18  Ht 5\' 8"  (1.727 m)  Wt 74.844 kg (165 lb)  BMI 25.09 kg/m2  SpO2 99%  lungs:   clear to auscultation cor:    RRR Abdomen:  soft, appropriate tenderness, incisions intact and without erythema or exudate. ex:    no cords   Lab Results  Component Value Date   WBC 12.3* 01/14/2011   HGB 10.6* 01/14/2011   HCT 31.4* 01/14/2011   MCV 89.5 01/14/2011   PLT 252 01/14/2011    A/P  Routine care.  D/C PCA and give pain meds by mouth.  Hgb is better than expected. D/C to home when tol full diet, voiding and ambulating and nausea under control.

## 2011-01-15 ENCOUNTER — Other Ambulatory Visit (HOSPITAL_COMMUNITY): Payer: Self-pay | Admitting: Obstetrics and Gynecology

## 2011-01-15 ENCOUNTER — Ambulatory Visit (HOSPITAL_COMMUNITY)
Admission: RE | Admit: 2011-01-15 | Discharge: 2011-01-15 | Disposition: A | Discharge status: Home / Self Care | Payer: 59 | Source: Ambulatory Visit | Attending: Obstetrics and Gynecology | Admitting: Obstetrics and Gynecology

## 2011-01-15 DIAGNOSIS — R110 Nausea: Secondary | ICD-10-CM | POA: Insufficient documentation

## 2011-01-15 DIAGNOSIS — K929 Disease of digestive system, unspecified: Secondary | ICD-10-CM | POA: Insufficient documentation

## 2011-01-15 DIAGNOSIS — R112 Nausea with vomiting, unspecified: Secondary | ICD-10-CM

## 2011-01-15 DIAGNOSIS — Y838 Other surgical procedures as the cause of abnormal reaction of the patient, or of later complication, without mention of misadventure at the time of the procedure: Secondary | ICD-10-CM | POA: Insufficient documentation

## 2011-01-15 DIAGNOSIS — Z9889 Other specified postprocedural states: Secondary | ICD-10-CM

## 2011-01-15 MED ORDER — IOHEXOL 300 MG/ML  SOLN
100.0000 mL | Freq: Once | INTRAMUSCULAR | Status: AC | PRN
  Administered 2011-01-15: 100 mL via INTRAVENOUS

## 2011-01-20 ENCOUNTER — Encounter (HOSPITAL_COMMUNITY): Payer: Self-pay | Admitting: Obstetrics and Gynecology

## 2011-10-13 ENCOUNTER — Other Ambulatory Visit: Payer: Self-pay | Admitting: Obstetrics and Gynecology

## 2012-05-22 ENCOUNTER — Emergency Department
Admission: EM | Admit: 2012-05-22 | Discharge: 2012-05-22 | Disposition: A | Discharge status: Home / Self Care | Payer: Self-pay | Source: Home / Self Care | Attending: Family Medicine | Admitting: Family Medicine

## 2012-05-22 ENCOUNTER — Encounter: Payer: Self-pay | Admitting: *Deleted

## 2012-05-22 DIAGNOSIS — J069 Acute upper respiratory infection, unspecified: Secondary | ICD-10-CM

## 2012-05-22 MED ORDER — BENZONATATE 200 MG PO CAPS: 200.0000 mg | ORAL_CAPSULE | Freq: Every day | ORAL | Status: DC

## 2012-05-22 MED ORDER — PSEUDOEPHEDRINE-GUAIFENESIN ER 120-1200 MG PO TB12: ORAL_TABLET | ORAL | Status: DC

## 2012-05-22 MED ORDER — AZITHROMYCIN 250 MG PO TABS: ORAL_TABLET | ORAL | Status: DC

## 2012-05-22 NOTE — ED Provider Notes (Signed)
History     CSN: [id number]  Arrival date & time 05/22/12  7829   First MD Initiated Contact with Patient 05/22/12 [phone]      Chief Complaint  Patient presents with  . Nasal Congestion  . URI      HPI Comments: Patient complains of approximately 7 day history of gradually progressive URI symptoms beginning with a mild sore throat (now improved), followed by nasal congestion.  A cough started about 4 days ago.  Complains of fatigue but minimal myalgias.  Cough is now worse at night and generally non-productive during the day.  There has been no pleuritic pain, shortness of breath, or wheezes.   The history is provided by the patient.    Past Medical History  Diagnosis Date  . Complication of anesthesia     :hard to wake"  . Elevated cholesterol     Past Surgical History  Procedure Date  . Laparoscopies     x2  . Carpel      carpel boss excision  . Robot assisted myomectomy 01/13/2011    Procedure: ROBOTIC ASSISTED MYOMECTOMY;  Surgeon: Loney Laurence;  Location: [facility];  Service: Gynecology;  Laterality: N/A;  with Chromopertubation;     Family History  Problem Relation Age of Onset  . Cancer Mother     breast CA    History  Substance Use Topics  . Smoking status: Never Smoker   . Smokeless tobacco: Never Used  . Alcohol Use: No    OB History    Grav Para Term Preterm Abortions TAB SAB Ect Mult Living                  Review of Systems + sore throat + cough No pleuritic pain No wheezing + nasal congestion + post-nasal drainage + hoarseness No sinus pain/pressure No itchy/red eyes ? earache No hemoptysis No SOB No fever, + chills No nausea No vomiting No abdominal pain No diarrhea No urinary symptoms No skin rashes + fatigue No myalgias + headache Used OTC meds without relief  Allergies  Augmentin; Cheese; Ciprofloxacin; and Tetracycline hcl  Home Medications   Current Outpatient Rx  Name  Route  Sig  Dispense  Refill  .  AZITHROMYCIN 250 MG PO TABS      Take 2 tabs today; then begin one tab once daily for 4 more days. (Rx void after 05/30/12)   6 each   0   . BENZONATATE 200 MG PO CAPS   Oral   Take 1 capsule (200 mg total) by mouth at bedtime. Take as needed for cough   12 capsule   0   . IRON 325 (65 FE) MG PO TABS   Oral   Take 1 tablet by mouth daily.           . MULTI-VITAMIN/MINERALS PO TABS   Oral   Take 1 tablet by mouth daily.           Marland Kitchen RIZATRIPTAN BENZOATE 5 MG PO TABS   Oral   Take 5 mg by mouth as needed. May repeat in 2 hours if needed          . SIMVASTATIN 20 MG PO TABS   Oral   Take 20 mg by mouth at bedtime.             BP 115/76  Pulse 90  Temp 98.6 F (37 C) (Oral)  Resp 16  Ht 5\' 8"  (1.727 m)  Wt 164 lb (  74.39 kg)  BMI 24.94 kg/m2  SpO2 98%  LMP 05/22/2012  Physical Exam Nursing notes and Vital Signs reviewed. Appearance:  Patient appears healthy, stated age, and in no acute distress Eyes:  Pupils are equal, round, and reactive to light and accomodation.  Extraocular movement is intact.  Conjunctivae are not inflamed  Ears:  Canals normal.  Tympanic membranes normal.  Nose:  Mildly congested turbinates.  No sinus tenderness.   Pharynx:  Normal Neck:  Supple.  Slightly tender shotty posterior nodes are palpated bilaterally  Lungs:  Clear to auscultation.  Breath sounds are equal.  Heart:  Regular rate and rhythm without murmurs, rubs, or gallops.  Abdomen:  Nontender without masses or hepatosplenomegaly.  Bowel sounds are present.  No CVA or flank tenderness.  Extremities:  No edema.  No calf tenderness Skin:  No rash present.   ED Course  Procedures none      1. Acute upper respiratory infections of unspecified site; suspect viral URI       MDM  There is no evidence of bacterial infection today.   Treat symptomatically for now.  Prescription written for Benzonatate [facility]) to take at bedtime for night-time cough.  Take Mucinex D  (guaifenesin with decongestant) twice daily for congestion.  Increase fluid intake, rest. May continue Mucinex nasal spray (or generic oxymetazoline) twice daily for about 5 days.  Also recommend using saline nasal spray several times daily and saline nasal irrigation (AYR is a common brand) Stop all antihistamines for now, and other non-prescription cough/cold preparations. Begin Azithromycin if not improving about 5 days or if persistent fever develops. Follow-up with family doctor if not improving 7 to 10 days.         Lattie Haw, MD 05/22/12 2011

## 2012-05-22 NOTE — ED Notes (Signed)
Patient c/o nasal congestion, sinus drainage, ear drainage and cough x 1 week. Denies fever. Taken Claritin and dayquil otc. Received flu vaccine this year.

## 2012-10-31 ENCOUNTER — Other Ambulatory Visit: Payer: Self-pay | Admitting: Obstetrics and Gynecology

## 2013-11-13 ENCOUNTER — Other Ambulatory Visit: Payer: Self-pay | Admitting: Obstetrics and Gynecology

## 2013-11-14 LAB — CYTOLOGY - PAP

## 2014-03-03 ENCOUNTER — Emergency Department
Admission: EM | Admit: 2014-03-03 | Discharge: 2014-03-03 | Disposition: A | Discharge status: Home / Self Care | Payer: 59 | Source: Home / Self Care | Attending: Family Medicine | Admitting: Family Medicine

## 2014-03-03 ENCOUNTER — Encounter: Payer: Self-pay | Admitting: Emergency Medicine

## 2014-03-03 DIAGNOSIS — J069 Acute upper respiratory infection, unspecified: Secondary | ICD-10-CM

## 2014-03-03 MED ORDER — PREDNISONE 10 MG PO TABS: 30.0000 mg | ORAL_TABLET | Freq: Every day | ORAL | Status: DC

## 2014-03-03 MED ORDER — IPRATROPIUM BROMIDE 0.06 % NA SOLN: 2.0000 | Freq: Four times a day (QID) | NASAL | Status: DC

## 2014-03-03 NOTE — Discharge Instructions (Signed)
Thank you for coming in today. Take Tylenol for pain or fever. Take prednisone for sinus pressure and pain. Use Atrovent nasal spray for runny nose or drainage. Call or go to the emergency room if you get worse, have trouble breathing, have chest pains, or palpitations.    Upper Respiratory Infection, Adult An upper respiratory infection (URI) is also sometimes known as the common cold. The upper respiratory tract includes the nose, sinuses, throat, trachea, and bronchi. Bronchi are the airways leading to the lungs. Most people improve within 1 week, but symptoms can last up to 2 weeks. A residual cough may last even longer.  CAUSES Many different viruses can infect the tissues lining the upper respiratory tract. The tissues become irritated and inflamed and often become very moist. Mucus production is also common. A cold is contagious. You can easily spread the virus to others by oral contact. This includes kissing, sharing a glass, coughing, or sneezing. Touching your mouth or nose and then touching a surface, which is then touched by another person, can also spread the virus. SYMPTOMS  Symptoms typically develop 1 to 3 days after you come in contact with a cold virus. Symptoms vary from person to person. They may include:  Runny nose.  Sneezing.  Nasal congestion.  Sinus irritation.  Sore throat.  Loss of voice (laryngitis).  Cough.  Fatigue.  Muscle aches.  Loss of appetite.  Headache.  Low-grade fever. DIAGNOSIS  You might diagnose your own cold based on familiar symptoms, since most people get a cold 2 to 3 times a year. Your caregiver can confirm this based on your exam. Most importantly, your caregiver can check that your symptoms are not due to another disease such as strep throat, sinusitis, pneumonia, asthma, or epiglottitis. Blood tests, throat tests, and X-rays are not necessary to diagnose a common cold, but they may sometimes be helpful in excluding other more  serious diseases. Your caregiver will decide if any further tests are required. RISKS AND COMPLICATIONS  You may be at risk for a more severe case of the common cold if you smoke cigarettes, have chronic heart disease (such as heart failure) or lung disease (such as asthma), or if you have a weakened immune system. The very young and very old are also at risk for more serious infections. Bacterial sinusitis, middle ear infections, and bacterial pneumonia can complicate the common cold. The common cold can worsen asthma and chronic obstructive pulmonary disease (COPD). Sometimes, these complications can require emergency medical care and may be life-threatening. PREVENTION  The best way to protect against getting a cold is to practice good hygiene. Avoid oral or hand contact with people with cold symptoms. Wash your hands often if contact occurs. There is no clear evidence that vitamin C, vitamin E, echinacea, or exercise reduces the chance of developing a cold. However, it is always recommended to get plenty of rest and practice good nutrition. TREATMENT  Treatment is directed at relieving symptoms. There is no cure. Antibiotics are not effective, because the infection is caused by a virus, not by bacteria. Treatment may include:  Increased fluid intake. Sports drinks offer valuable electrolytes, sugars, and fluids.  Breathing heated mist or steam (vaporizer or shower).  Eating chicken soup or other clear broths, and maintaining good nutrition.  Getting plenty of rest.  Using gargles or lozenges for comfort.  Controlling fevers with ibuprofen or acetaminophen as directed by your caregiver.  Increasing usage of your inhaler if you have asthma. Zinc  gel and zinc lozenges, taken in the first 24 hours of the common cold, can shorten the duration and lessen the severity of symptoms. Pain medicines may help with fever, muscle aches, and throat pain. A variety of non-prescription medicines are  available to treat congestion and runny nose. Your caregiver can make recommendations and may suggest nasal or lung inhalers for other symptoms.  HOME CARE INSTRUCTIONS   Only take over-the-counter or prescription medicines for pain, discomfort, or fever as directed by your caregiver.  Use a warm mist humidifier or inhale steam from a shower to increase air moisture. This may keep secretions moist and make it easier to breathe.  Drink enough water and fluids to keep your urine clear or pale yellow.  Rest as needed.  Return to work when your temperature has returned to normal or as your caregiver advises. You may need to stay home longer to avoid infecting others. You can also use a face mask and careful hand washing to prevent spread of the virus. SEEK MEDICAL CARE IF:   After the first few days, you feel you are getting worse rather than better.  You need your caregiver's advice about medicines to control symptoms.  You develop chills, worsening shortness of breath, or brown or red sputum. These may be signs of pneumonia.  You develop yellow or brown nasal discharge or pain in the face, especially when you bend forward. These may be signs of sinusitis.  You develop a fever, swollen neck glands, pain with swallowing, or white areas in the back of your throat. These may be signs of strep throat. SEEK IMMEDIATE MEDICAL CARE IF:   You have a fever.  You develop severe or persistent headache, ear pain, sinus pain, or chest pain.  You develop wheezing, a prolonged cough, cough up blood, or have a change in your usual mucus (if you have chronic lung disease).  You develop sore muscles or a stiff neck. Document Released: 10/20/2000 Document Revised: 07/19/2011 Document Reviewed: 08/01/2013 [facility] Patient Information 2015 [place], [place]. This information is not intended to replace advice given to you by your health care provider. Make sure you discuss any questions you have with your  health care provider.

## 2014-03-03 NOTE — ED Provider Notes (Signed)
Sarah Mendez is a 48 y.o. female who presents to Urgent Care today for sinus congestion runny nose postnasal drip and body aches. Symptoms present for 3-4 days. She has tried Claritin and Mucomyst nasal spray which have not helped. She denies shortness of breath fevers chills nausea vomiting or diarrhea. She feels well otherwise.   Past Medical History  Diagnosis Date  . Complication of anesthesia     :hard to wake"  . Elevated cholesterol    History  Substance Use Topics  . Smoking status: Never Smoker   . Smokeless tobacco: Never Used  . Alcohol Use: No   ROS as above Medications: No current facility-administered medications for this encounter.   Current Outpatient Prescriptions  Medication Sig Dispense Refill  . ipratropium (ATROVENT) 0.06 % nasal spray Place 2 sprays into both nostrils 4 (four) times daily.  15 mL  1  . predniSONE (DELTASONE) 10 MG tablet Take 3 tablets (30 mg total) by mouth daily.  15 tablet  0  . [DISCONTINUED] Ferrous Sulfate (IRON) 325 (65 FE) MG TABS Take 1 tablet by mouth daily.        . [DISCONTINUED] rizatriptan (MAXALT) 5 MG tablet Take 5 mg by mouth as needed. May repeat in 2 hours if needed       . [DISCONTINUED] simvastatin (ZOCOR) 20 MG tablet Take 20 mg by mouth at bedtime.          Exam:  BP 112/73  Pulse 78  Temp(Src) 98.4 F (36.9 C) (Oral)  Resp 16  Ht 5\' 8"  (1.727 m)  Wt 166 lb (75.297 kg)  BMI 25.25 kg/m2  SpO2 97%  LMP 02/27/2014 Gen: Well NAD HEENT: EOMI,  MMM posterior pharynx with mild cobblestoning. Normal tympanic membranes bilaterally. Clear nasal discharge and mildly inflamed nasal turbinates present. Lungs: Normal work of breathing. CTABL Heart: RRR no MRG Abd: NABS, Soft. Nondistended, Nontender Exts: Brisk capillary refill, warm and well perfused.   No results found for this or any previous visit (from the past 24 hour(s)). No results found.  Assessment and Plan: 48 y.o. female with viral URI. Symptomatically  management with Tylenol Atrovent nasal spray and prednisone if not improved or better.  Discussed warning signs or symptoms. Please see discharge instructions. Patient expresses understanding.     Gregor Hams, MD 03/03/14 [phone]

## 2014-03-03 NOTE — ED Notes (Signed)
Congestion and body aches x 2 days.

## 2014-04-07 ENCOUNTER — Emergency Department (INDEPENDENT_AMBULATORY_CARE_PROVIDER_SITE_OTHER): Payer: 59

## 2014-04-07 ENCOUNTER — Emergency Department
Admission: EM | Admit: 2014-04-07 | Discharge: 2014-04-07 | Disposition: A | Discharge status: Home / Self Care | Payer: 59 | Source: Home / Self Care | Attending: Family Medicine | Admitting: Family Medicine

## 2014-04-07 DIAGNOSIS — M79659 Pain in unspecified thigh: Secondary | ICD-10-CM

## 2014-04-07 DIAGNOSIS — M25551 Pain in right hip: Secondary | ICD-10-CM

## 2014-04-07 MED ORDER — DICLOFENAC SODIUM 50 MG PO TBEC: 50.0000 mg | DELAYED_RELEASE_TABLET | Freq: Two times a day (BID) | ORAL | Status: DC | PRN

## 2014-04-07 NOTE — ED Notes (Signed)
C/o burning pain to right thigh, states pain is worse after working, rates pain 9/10/  No none injury, pain started approx. One month ago

## 2014-04-07 NOTE — ED Provider Notes (Signed)
Sarah Mendez is a 48 y.o. female who presents to Urgent Care today for right thigh pain. Patient has pain in the right thigh present for about one month. She describes a burning or pain sensation. The symptoms do not seem to be related to activity. She denies any injury. No further radiating pain weakness or numbness. No back pain.  Past Medical History  Diagnosis Date  . Complication of anesthesia     :hard to wake"  . Elevated cholesterol    Past Surgical History  Procedure Laterality Date  . Laparoscopies      x2  . Carpel       carpel boss excision  . Robot assisted myomectomy  01/13/2011    Procedure: ROBOTIC ASSISTED MYOMECTOMY;  Surgeon: Daria Pastures;  Location: [facility];  Service: Gynecology;  Laterality: N/A;  with Chromopertubation;    History  Substance Use Topics  . Smoking status: Never Smoker   . Smokeless tobacco: Never Used  . Alcohol Use: No   ROS as above Medications: No current facility-administered medications for this encounter.   Current Outpatient Prescriptions  Medication Sig Dispense Refill  . diclofenac (VOLTAREN) 50 MG EC tablet Take 1 tablet (50 mg total) by mouth 2 (two) times daily as needed. 60 tablet 0  . [DISCONTINUED] Ferrous Sulfate (IRON) 325 (65 FE) MG TABS Take 1 tablet by mouth daily.      . [DISCONTINUED] ipratropium (ATROVENT) 0.06 % nasal spray Place 2 sprays into both nostrils 4 (four) times daily. 15 mL 1  . [DISCONTINUED] rizatriptan (MAXALT) 5 MG tablet Take 5 mg by mouth as needed. May repeat in 2 hours if needed     . [DISCONTINUED] simvastatin (ZOCOR) 20 MG tablet Take 20 mg by mouth at bedtime.       Allergies  Allergen Reactions  . Augmentin [Amoxicillin-Pot Clavulanate] Diarrhea and Nausea And Vomiting  . Cheese     Ricotta cheese   . Ciprofloxacin     REACTION: nausea/vomiting/diarrhea  . Tetracycline Hcl     REACTION: nausea/vomiting/diarrhea     Exam:  BP 116/75 mmHg  Pulse 71  Temp(Src) 97.5 F (36.4 C)  (Oral)  Ht 5\' 8"  (1.727 m)  Wt 168 lb (76.204 kg)  BMI 25.55 kg/m2  SpO2 100%  LMP 03/31/2014 Gen: Well NAD HEENT: EOMI,  MMM Lungs: Normal work of breathing. CTABL Heart: RRR no MRG Abd: NABS, Soft. Nondistended, Nontender Exts: Brisk capillary refill, warm and well perfused.  Back: Nontender normal range of motion negative for leg raise test. Negative Faber test. Hip range of motion is intact. Tender palpation right greater trochanter mildly. Hip abduction strength is normal. Thigh nontender normal strength. No radiating pain when palpation over the lateral femoral cutaneous nerve area.  No results found for this or any previous visit (from the past 24 hour(s)). Dg Hip Complete Right  04/07/2014   CLINICAL DATA:  Right hip pain for 1 month, no known injury  EXAM: RIGHT HIP - COMPLETE 2+ VIEW  COMPARISON:  None.  FINDINGS: Three views of the right hip submitted. No acute fracture or subluxation. No radiopaque foreign body. Bilateral hip joints are symmetrical in appearance. SI joints are unremarkable.  IMPRESSION: Negative.   Electronically Signed   By: Lahoma Crocker M.D.   On: 04/07/2014 15:38    Assessment and Plan: 48 y.o. female with thigh pain. Unclear etiology. Possibly meralgia paresthetica versus trochanteric bursitis. Trial of diclofenac and follow-up with Dr. Darene Lamer  Discussed warning signs  or symptoms. Please see discharge instructions. Patient expresses understanding.     Gregor Hams, MD 04/07/14 [phone]

## 2014-04-07 NOTE — Discharge Instructions (Signed)
Thank you for coming in today. Follow up with Dr. Darene Lamer  Trochanteric Bursitis You have hip pain due to trochanteric bursitis. Bursitis means that the sack near the outside of the hip is filled with fluid and inflamed. This sack is made up of protective soft tissue. The pain from trochanteric bursitis can be severe and keep you from sleep. It can radiate to the buttocks or down the outside of the thigh to the knee. The pain is almost always worse when rising from the seated or lying position and with walking. Pain can improve after you take a few steps. It happens more often in people with hip joint and lumbar spine problems, such as arthritis or previous surgery. Very rarely the trochanteric bursa can become infected, and antibiotics and/or surgery may be needed. Treatment often includes an injection of local anesthetic mixed with cortisone medicine. This medicine is injected into the area where it is most tender over the hip. Repeat injections may be necessary if the response to treatment is slow. You can apply ice packs over the tender area for 30 minutes every 2 hours for the next few days. Anti-inflammatory and/or narcotic pain medicine may also be helpful. Limit your activity for the next few days if the pain continues. See your caregiver in 5-10 days if you are not greatly improved.  SEEK IMMEDIATE MEDICAL CARE IF:  You develop severe pain, fever, or increased redness.  You have pain that radiates below the knee. EXERCISES STRETCHING EXERCISES - Trochanteric Bursitis  These exercises may help you when beginning to rehabilitate your injury. Your symptoms may resolve with or without further involvement from your physician, physical therapist, or athletic trainer. While completing these exercises, remember:   Restoring tissue flexibility helps normal motion to return to the joints. This allows healthier, less painful movement and activity.  An effective stretch should be held for at least 30  seconds.  A stretch should never be painful. You should only feel a gentle lengthening or release in the stretched tissue. STRETCH - Iliotibial Band  On the floor or bed, lie on your side so your injured leg is on top. Bend your knee and grab your ankle.  Slowly bring your knee back so that your thigh is in line with your trunk. Keep your heel at your buttocks and gently arch your back so your head, shoulders and hips line up.  Slowly lower your leg so that your knee approaches the floor/bed until you feel a gentle stretch on the outside of your thigh. If you do not feel a stretch and your knee will not fall farther, place the heel of your opposite foot on top of your knee and pull your thigh down farther.  Hold this stretch for __________ seconds.  Repeat __________ times. Complete this exercise __________ times per day. STRETCH - Hamstrings, Supine   Lie on your back. Loop a belt or towel over the ball of your foot as shown.  Straighten your knee and slowly pull on the belt to raise your injured leg. Do not allow the knee to bend. Keep your opposite leg flat on the floor.  Raise the leg until you feel a gentle stretch behind your knee or thigh. Hold this position for __________ seconds.  Repeat __________ times. Complete this stretch __________ times per day. STRETCH - Quadriceps, Prone   Lie on your stomach on a firm surface, such as a bed or padded floor.  Bend your knee and grasp your ankle. If you  are unable to reach your ankle or pant leg, use a belt around your foot to lengthen your reach.  Gently pull your heel toward your buttocks. Your knee should not slide out to the side. You should feel a stretch in the front of your thigh and/or knee.  Hold this position for __________ seconds.  Repeat __________ times. Complete this stretch __________ times per day. STRETCHING - Hip Flexors, Lunge Half kneel with your knee on the floor and your opposite knee bent and directly over  your ankle.  Keep good posture with your head over your shoulders. Tighten your buttocks to point your tailbone downward; this will prevent your back from arching too much.  You should feel a gentle stretch in the front of your thigh and/or hip. If you do not feel any resistance, slightly slide your opposite foot forward and then slowly lunge forward so your knee once again lines up over your ankle. Be sure your tailbone remains pointed downward.  Hold this stretch for __________ seconds.  Repeat __________ times. Complete this stretch __________ times per day. STRETCH - Adductors, Lunge  While standing, spread your legs.  Lean away from your injured leg by bending your opposite knee. You may rest your hands on your thigh for balance.  You should feel a stretch in your inner thigh. Hold for __________ seconds.  Repeat __________ times. Complete this exercise __________ times per day. Document Released: 06/03/2004 Document Revised: 09/10/2013 Document Reviewed: 08/08/2008 [facility] Patient Information 2015 [place], [place]. This information is not intended to replace advice given to you by your health care provider. Make sure you discuss any questions you have with your health care provider.

## 2014-07-08 ENCOUNTER — Encounter: Payer: Self-pay | Admitting: Family Medicine

## 2014-07-08 ENCOUNTER — Ambulatory Visit (INDEPENDENT_AMBULATORY_CARE_PROVIDER_SITE_OTHER): Payer: 59 | Admitting: Family Medicine

## 2014-07-08 VITALS — Ht 68.0 in | Wt 171.5 lb

## 2014-07-08 DIAGNOSIS — E663 Overweight: Secondary | ICD-10-CM

## 2014-07-08 NOTE — Patient Instructions (Addendum)
-   Aim to be more deliberate in getting adequate sleep.  Consider getting a sleep app such as SLEEPCYCLE.    - Write down your sleep hours each night.   - Obtain twice as many veg's as protein or carbohydrate foods for both lunch and dinner.  - Studies show that people are more satisfied with meals that have at least 3 components.   - Physical activity goal:  Get at least 45 minutes once a week.    - Document your workouts each time in [employer] website AND on your kitchen calendar.    - Plan your workouts, and write them in your planner.    - Also on your calendar, at the end of the week, write down your total weekly hours of sleep and # of times of exercising each week.    - Bring your calendar or weekly totals to your follow-up appt.

## 2014-07-08 NOTE — Progress Notes (Signed)
Medical Nutrition Therapy:  Appt start time: 1000 end time:  1100.  Assessment:  Primary concerns today: Weight management. (E66.3; BMI 25-29.9).  Learning Readiness: Change in progress; started exercising, and has been trying to eat healthfully.    Barriers to learning/adherence to lifestyle change: Works 7P to 7 [employer] 3 X wk, different days each week.  Sarah Mendez usually gets no more than 5 1/2 hrs of sleep a night.  Learns the work schedule 6 wks in advance.    Usual eating pattern includes 3 meals and 1-2 snacks per day. Frequent foods and beverages include water, 1 c herb tea with 1 tbsp honey, [place] sparkling water, cooked greens, salad 3-4 X wk, tuna/salmon 2 X wk, homemade dressing.  Avoided foods include caffeine, pork, most fast foods.   Usual physical activity changes weekly depending on her work schedule.  Has a [employer] membership, and sometimes uses [employer] fitness classes, but has struggled with any consistency b/c of changing work schedule (and fatigue following w consecutive work days).    24-hr recall: (Up at 8:30 AM) B (10:30 AM)-  1 biscuit, 2 [place] bacon, 1 pear, water Snk ( AM)-    L (2 PM)-  4 oz chx, 1 c mixed veg's, 1/2 c grn beans, 1/4 tsp fake butter, 1 c crm'd corn, water Snk ( PM)-   D (8 PM)-  4 oz chx, 1 c crm'd corn, 1/2 c grn beans  Snk (11:45)-  1 pear Typical day? Yes.  for a Sunday.  On a typical work day bkfst will be ~9:  [place], eggs, inst grits, tea; to bed by 10; up ~3:30 PM; lunch ~4; snack 9 PM; dinner at 1 or 3 AM.    Progress Towards Goal(s):  In progress.   Nutritional Diagnosis:  NB-2.1 Physical inactivity As related to time constraints and fatigue.  As evidenced by no consistent physical activity.    Intervention:  Nutrition education.  Handouts given during visit include:  AVS  Demonstrated degree of understanding via:  Teach Back   Monitoring/Evaluation:  Dietary intake, exercise, and body weight in 5 week(s).

## 2014-08-15 ENCOUNTER — Ambulatory Visit: Payer: 59 | Admitting: Family Medicine

## 2015-06-03 MED FILL — ATORVASTATIN 10 MG TABLET: 10 | 30 days supply | Qty: 30 | Fill #6

## 2015-06-28 IMAGING — CR DG HIP COMPLETE 2+V*R*
3 series · 3 of 3 positions shown · non-contrast
Comparison: None.

CLINICAL DATA: Right hip pain for 1 month, no known injury

EXAM:
RIGHT HIP - COMPLETE 2+ VIEW

[view not recorded (1 of 3)]
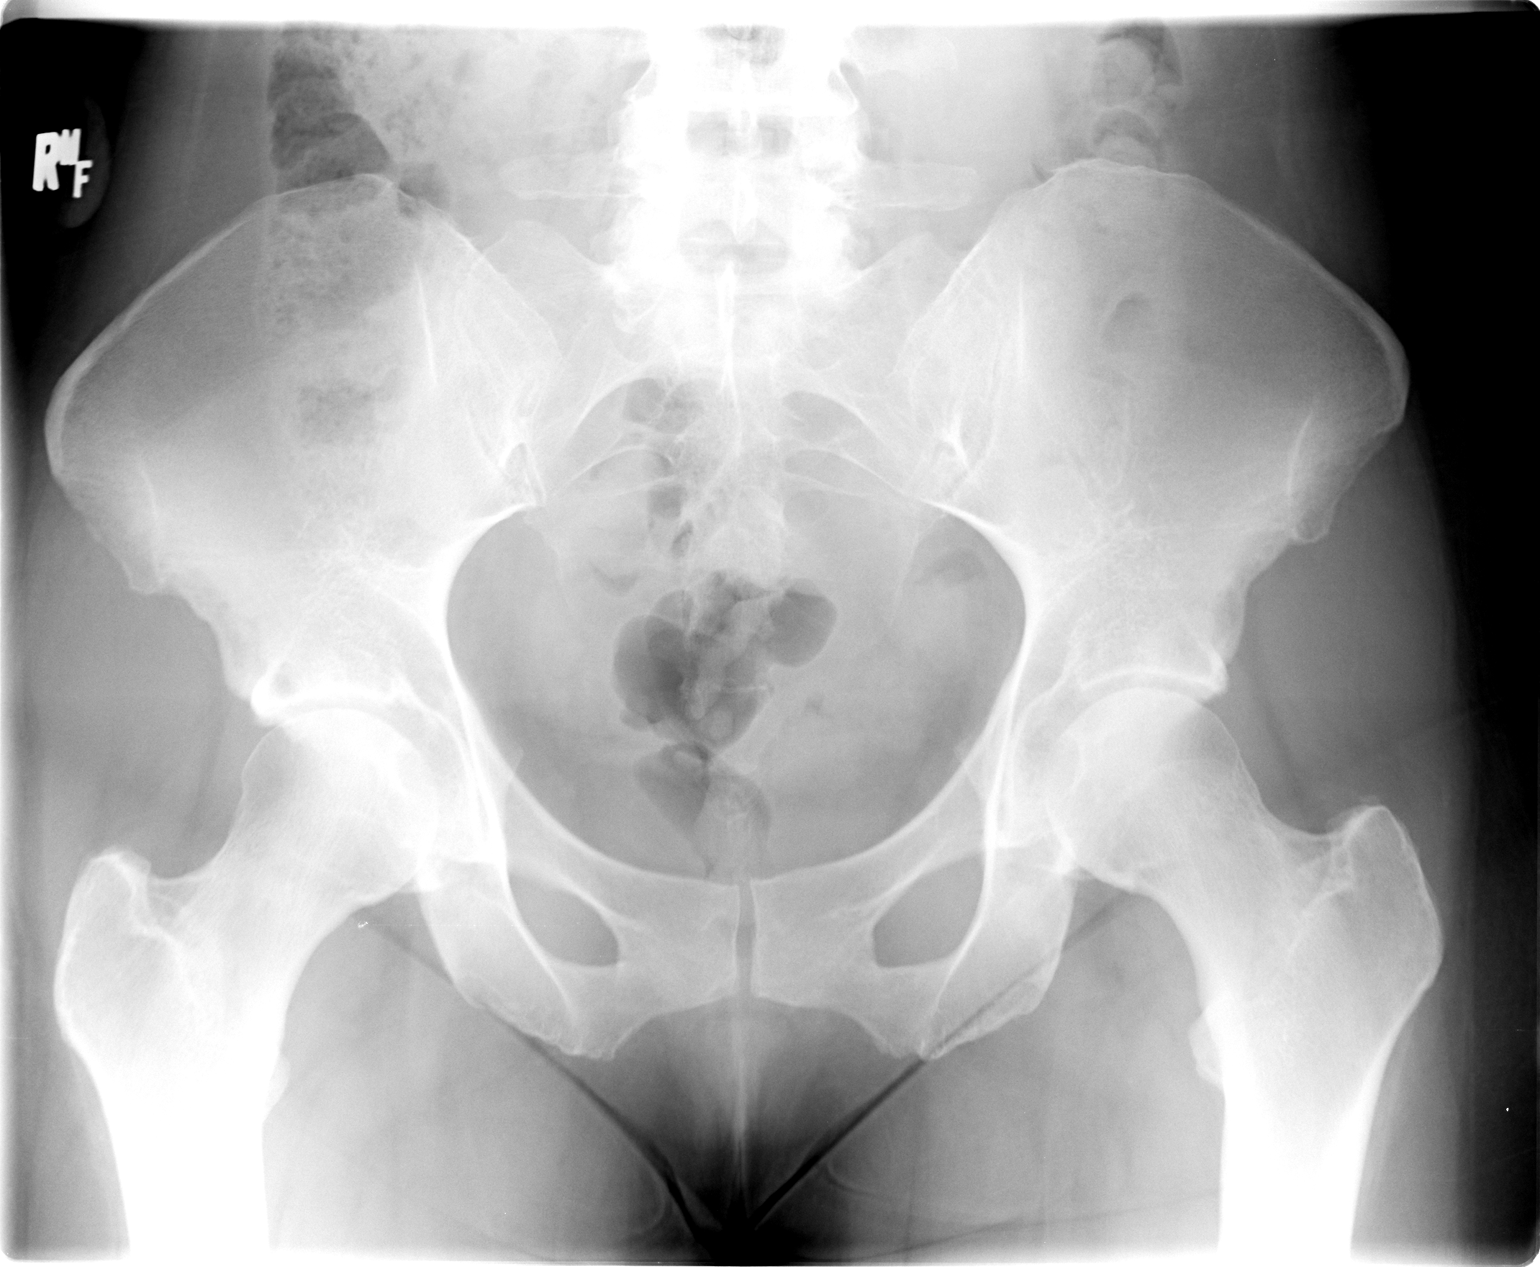

[view not recorded (2 of 3)]
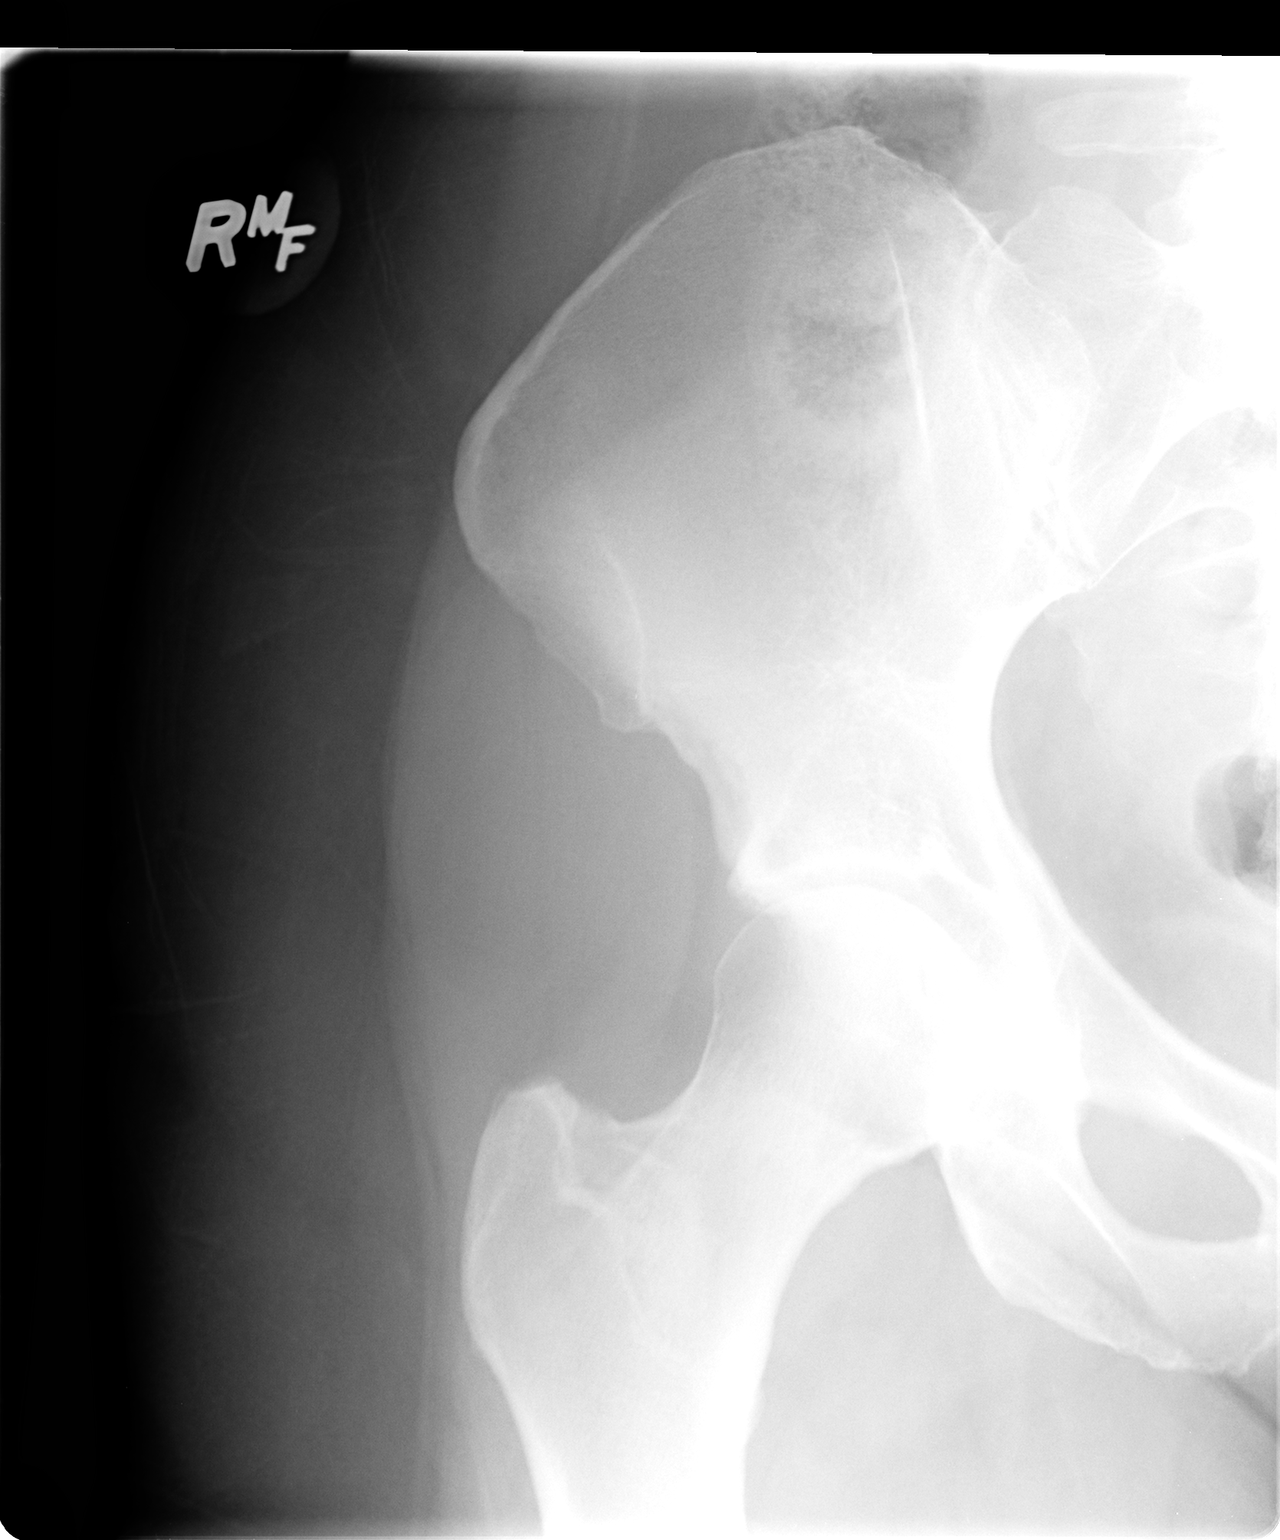

[view not recorded (3 of 3)]
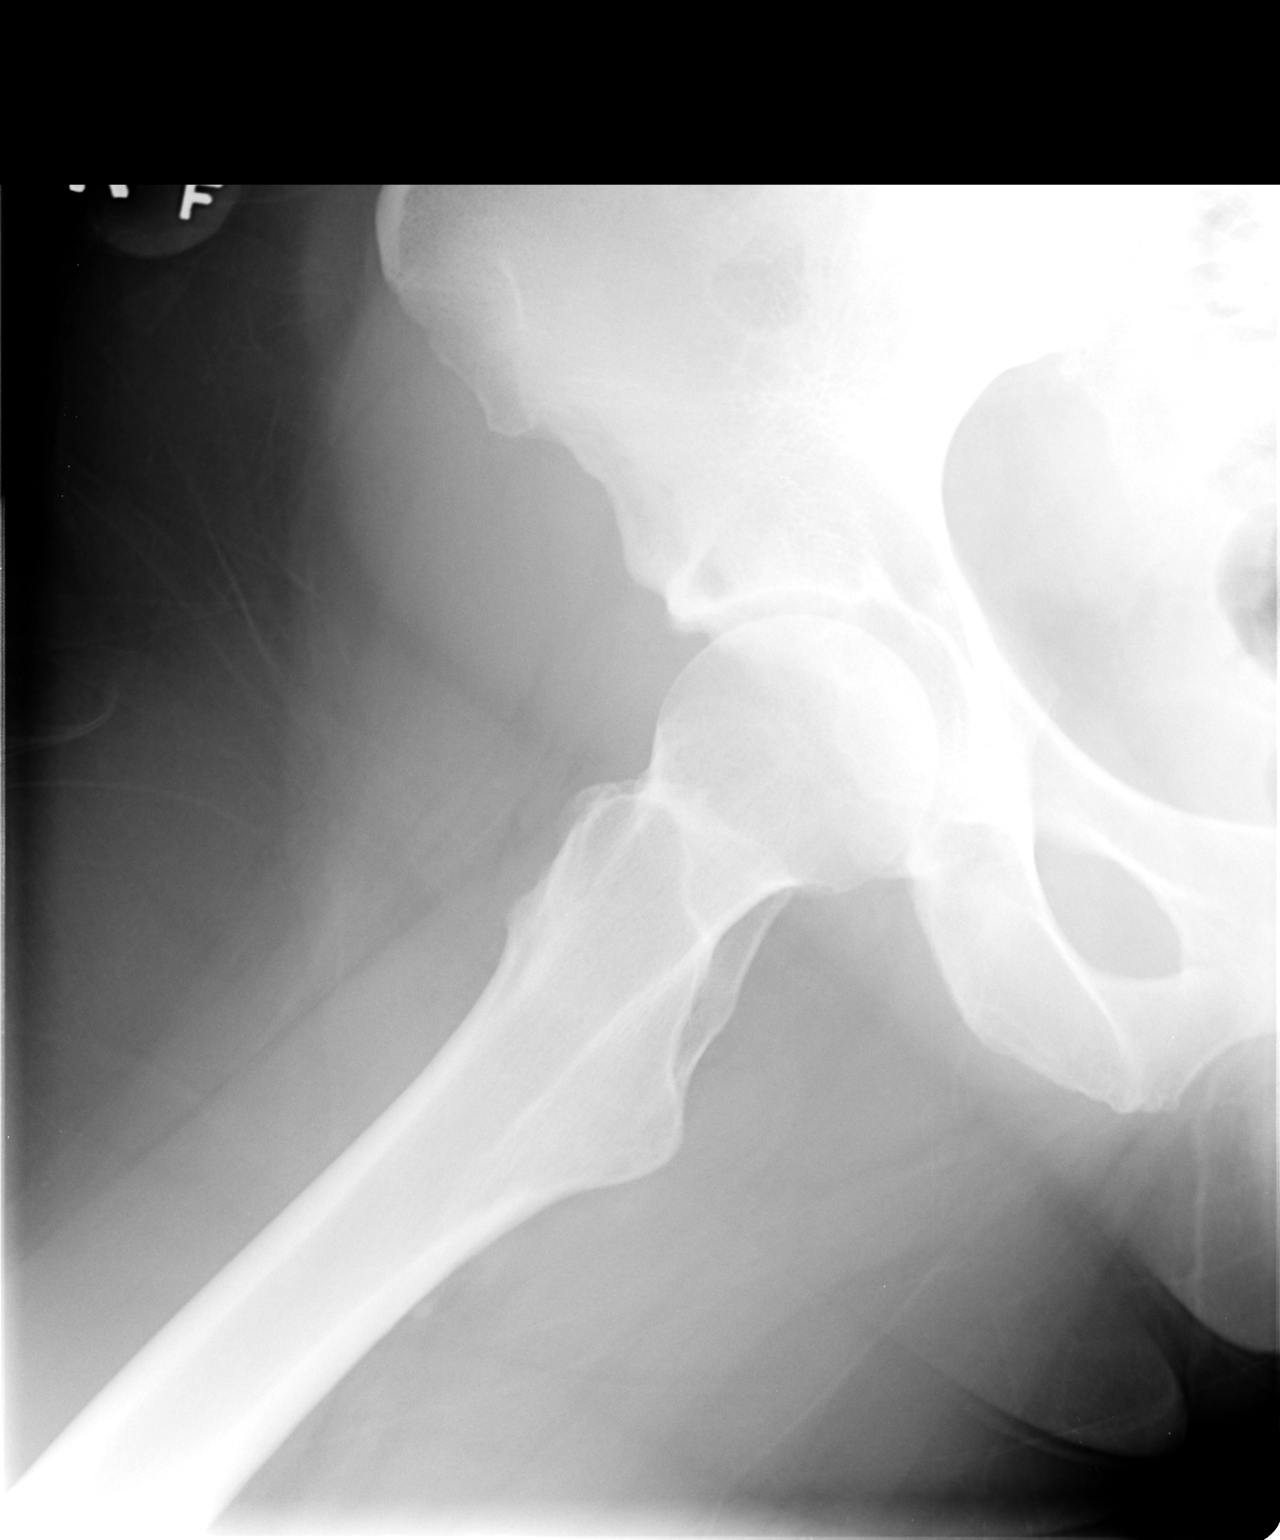

[3 of 3 positions shown; findings below may reference images not displayed]

FINDINGS: Three views of the right hip submitted. No acute fracture or
subluxation. No radiopaque foreign body. Bilateral hip joints are
symmetrical in appearance. SI joints are unremarkable.
IMPRESSION: Negative.

## 2015-07-09 MED FILL — ATORVASTATIN 10 MG TABLET: 10 | 90 days supply | Qty: 90 | Fill #7

## 2015-07-29 DIAGNOSIS — E785 Hyperlipidemia, unspecified: Secondary | ICD-10-CM | POA: Diagnosis not present

## 2015-07-29 DIAGNOSIS — K5900 Constipation, unspecified: Secondary | ICD-10-CM | POA: Diagnosis not present

## 2015-07-29 DIAGNOSIS — R109 Unspecified abdominal pain: Secondary | ICD-10-CM | POA: Diagnosis not present

## 2015-08-18 DIAGNOSIS — R109 Unspecified abdominal pain: Secondary | ICD-10-CM | POA: Diagnosis not present

## 2015-10-20 MED FILL — ATORVASTATIN 10 MG TABLET: 10 | 90 days supply | Qty: 90 | Fill #0

## 2015-11-03 DIAGNOSIS — M545 Low back pain: Secondary | ICD-10-CM | POA: Diagnosis not present

## 2015-11-03 DIAGNOSIS — R1031 Right lower quadrant pain: Secondary | ICD-10-CM | POA: Diagnosis not present

## 2015-11-03 DIAGNOSIS — Z0000 Encounter for general adult medical examination without abnormal findings: Secondary | ICD-10-CM | POA: Diagnosis not present

## 2015-11-24 DIAGNOSIS — H5213 Myopia, bilateral: Secondary | ICD-10-CM | POA: Diagnosis not present

## 2015-11-24 DIAGNOSIS — R928 Other abnormal and inconclusive findings on diagnostic imaging of breast: Secondary | ICD-10-CM | POA: Diagnosis not present

## 2015-11-24 DIAGNOSIS — Z1231 Encounter for screening mammogram for malignant neoplasm of breast: Secondary | ICD-10-CM | POA: Diagnosis not present

## 2015-11-24 DIAGNOSIS — N63 Unspecified lump in breast: Secondary | ICD-10-CM | POA: Diagnosis not present

## 2015-11-24 DIAGNOSIS — N6489 Other specified disorders of breast: Secondary | ICD-10-CM | POA: Diagnosis not present

## 2015-11-24 DIAGNOSIS — H524 Presbyopia: Secondary | ICD-10-CM | POA: Diagnosis not present

## 2015-11-28 ENCOUNTER — Other Ambulatory Visit: Payer: Self-pay | Admitting: Obstetrics and Gynecology

## 2015-11-28 DIAGNOSIS — Z124 Encounter for screening for malignant neoplasm of cervix: Secondary | ICD-10-CM | POA: Diagnosis not present

## 2015-11-28 DIAGNOSIS — Z6826 Body mass index (BMI) 26.0-26.9, adult: Secondary | ICD-10-CM | POA: Diagnosis not present

## 2015-11-28 DIAGNOSIS — Z01419 Encounter for gynecological examination (general) (routine) without abnormal findings: Secondary | ICD-10-CM | POA: Diagnosis not present

## 2015-11-28 MED FILL — IBUPROFEN 800 MG TABLET: 800 | 20 days supply | Qty: 60 | Fill #0

## 2015-12-01 MED FILL — GAVILYTE-G SOLUTION: 236 | 2 days supply | Qty: 4000 | Fill #0

## 2015-12-02 LAB — CYTOLOGY - PAP

## 2015-12-05 DIAGNOSIS — M62838 Other muscle spasm: Secondary | ICD-10-CM | POA: Diagnosis not present

## 2015-12-05 DIAGNOSIS — K5909 Other constipation: Secondary | ICD-10-CM | POA: Diagnosis not present

## 2015-12-05 DIAGNOSIS — K648 Other hemorrhoids: Secondary | ICD-10-CM | POA: Diagnosis not present

## 2015-12-05 DIAGNOSIS — K219 Gastro-esophageal reflux disease without esophagitis: Secondary | ICD-10-CM | POA: Diagnosis not present

## 2015-12-05 DIAGNOSIS — N809 Endometriosis, unspecified: Secondary | ICD-10-CM | POA: Diagnosis not present

## 2015-12-05 DIAGNOSIS — M545 Low back pain: Secondary | ICD-10-CM | POA: Diagnosis not present

## 2015-12-05 DIAGNOSIS — E785 Hyperlipidemia, unspecified: Secondary | ICD-10-CM | POA: Diagnosis not present

## 2015-12-05 DIAGNOSIS — Z1211 Encounter for screening for malignant neoplasm of colon: Secondary | ICD-10-CM | POA: Diagnosis not present

## 2015-12-05 DIAGNOSIS — G4700 Insomnia, unspecified: Secondary | ICD-10-CM | POA: Diagnosis not present

## 2015-12-15 DIAGNOSIS — R928 Other abnormal and inconclusive findings on diagnostic imaging of breast: Secondary | ICD-10-CM | POA: Diagnosis not present

## 2015-12-15 DIAGNOSIS — N63 Unspecified lump in breast: Secondary | ICD-10-CM | POA: Diagnosis not present

## 2015-12-22 MED FILL — CYCLOBENZAPRINE 10 MG TAB: 10 | 30 days supply | Qty: 30 | Fill #0

## 2015-12-22 MED FILL — traMADol HCL 50 MG TABS: 50 | 8 days supply | Qty: 30 | Fill #0

## 2015-12-22 MED FILL — TEMAZEPAM 15 MG CAPSULE: 15 | 30 days supply | Qty: 60 | Fill #0

## 2016-01-07 DIAGNOSIS — N920 Excessive and frequent menstruation with regular cycle: Secondary | ICD-10-CM | POA: Diagnosis not present

## 2016-01-07 DIAGNOSIS — D259 Leiomyoma of uterus, unspecified: Secondary | ICD-10-CM | POA: Diagnosis not present

## 2016-01-14 MED FILL — ATORVASTATIN 10 MG TABLET: 10 | 90 days supply | Qty: 90 | Fill #1

## 2016-02-04 MED FILL — HYDROCODON-APAP 5-325: 5-325 | 3 days supply | Qty: 10 | Fill #0

## 2016-02-11 ENCOUNTER — Other Ambulatory Visit: Payer: Self-pay | Admitting: Obstetrics and Gynecology

## 2016-02-11 DIAGNOSIS — N920 Excessive and frequent menstruation with regular cycle: Secondary | ICD-10-CM | POA: Diagnosis not present

## 2016-02-24 ENCOUNTER — Other Ambulatory Visit: Payer: Self-pay | Admitting: Obstetrics and Gynecology

## 2016-02-24 DIAGNOSIS — N760 Acute vaginitis: Secondary | ICD-10-CM | POA: Diagnosis not present

## 2016-02-24 DIAGNOSIS — R1031 Right lower quadrant pain: Secondary | ICD-10-CM | POA: Diagnosis not present

## 2016-02-24 DIAGNOSIS — Z029 Encounter for administrative examinations, unspecified: Secondary | ICD-10-CM | POA: Diagnosis not present

## 2016-06-03 ENCOUNTER — Encounter: Payer: Self-pay | Admitting: Emergency Medicine

## 2016-06-03 ENCOUNTER — Emergency Department (INDEPENDENT_AMBULATORY_CARE_PROVIDER_SITE_OTHER)
Admission: EM | Admit: 2016-06-03 | Discharge: 2016-06-03 | Disposition: A | Discharge status: Home / Self Care | Payer: 59 | Source: Home / Self Care | Attending: Family Medicine | Admitting: Family Medicine

## 2016-06-03 ENCOUNTER — Emergency Department (INDEPENDENT_AMBULATORY_CARE_PROVIDER_SITE_OTHER): Payer: 59

## 2016-06-03 DIAGNOSIS — J181 Lobar pneumonia, unspecified organism: Secondary | ICD-10-CM | POA: Diagnosis not present

## 2016-06-03 DIAGNOSIS — R05 Cough: Secondary | ICD-10-CM | POA: Diagnosis not present

## 2016-06-03 DIAGNOSIS — R918 Other nonspecific abnormal finding of lung field: Secondary | ICD-10-CM | POA: Diagnosis not present

## 2016-06-03 DIAGNOSIS — J189 Pneumonia, unspecified organism: Secondary | ICD-10-CM

## 2016-06-03 MED ORDER — OSELTAMIVIR PHOSPHATE 75 MG PO CAPS: 75.0000 mg | ORAL_CAPSULE | Freq: Two times a day (BID) | ORAL | 0 refills | Status: AC

## 2016-06-03 MED ORDER — BENZONATATE 200 MG PO CAPS: ORAL_CAPSULE | ORAL | 0 refills | Status: AC

## 2016-06-03 MED ORDER — AZITHROMYCIN 250 MG PO TABS: ORAL_TABLET | ORAL | 0 refills | Status: AC

## 2016-06-03 MED FILL — ATORVASTATIN 10 MG TABLET: 10 | 90 days supply | Qty: 90 | Fill #2

## 2016-06-03 MED FILL — OSELTAMIVIR PHOS 75 MG CAP: 75 | 5 days supply | Qty: 10 | Fill #0

## 2016-06-03 MED FILL — AZITHROMYCIN 250 MG TABLET: 250 | 5 days supply | Qty: 6 | Fill #0

## 2016-06-03 MED FILL — BENZONATATE 200 MG CAPSULE: 200 | 7 days supply | Qty: 15 | Fill #0

## 2016-06-03 NOTE — ED Provider Notes (Signed)
[facility]    CSN: [id number] Arrival date & time: 06/03/16  [id number]     History   Chief Complaint Chief Complaint  Patient presents with  . Nasal Congestion  . Facial Pain  . Cough    HPI Sarah Mendez is a 51 y.o. female.   Patient reports that she developed a URI about a month ago.  Her symptoms seemed to resolve after a 10 day course of doxycycline 100mg  BID. One week ago she developed typical cold-like symptoms developing over several days, including mild sore throat, sinus congestion, headache, fatigue, and cough.  Her cough and chills persist.  No pleuritic pain. She reports that she will be leaving on a 10 day trip to [place] soon.   The history is provided by the patient.    Past Medical History:  Diagnosis Date  . Complication of anesthesia    :hard to wake"  . Elevated cholesterol     Patient Active Problem List   Diagnosis Date Noted  . URI 09/19/2009    Past Surgical History:  Procedure Laterality Date  . carpel      carpel boss excision  . laparoscopies     x2  . ROBOT ASSISTED MYOMECTOMY  01/13/2011   Procedure: ROBOTIC ASSISTED MYOMECTOMY;  Surgeon: Daria Pastures;  Location: [facility];  Service: Gynecology;  Laterality: N/A;  with Chromopertubation;     OB History    No data available       Home Medications    Prior to Admission medications   Medication Sig Start Date End Date Taking? Authorizing Provider  simvastatin (ZOCOR) 10 MG tablet Take 10 mg by mouth daily.   Yes Historical Provider, MD  traMADol (ULTRAM) 50 MG tablet Take 50 mg by mouth every 6 (six) hours as needed (for headaches).   Yes Historical Provider, MD  azithromycin (ZITHROMAX Z-PAK) 250 MG tablet Take 2 tabs today; then begin one tab once daily for 4 more days. 06/03/16   Kandra Nicolas, MD  benzonatate (TESSALON) 200 MG capsule Take one cap by mouth at bedtime as needed for cough.  May repeat in 4 to 6 hours 06/03/16   Kandra Nicolas, MD  ibuprofen  (ADVIL,MOTRIN) 800 MG tablet Take 800 mg by mouth every 8 (eight) hours as needed.    Historical Provider, MD  Multiple Vitamins-Minerals (MULTIVITAMIN WITH MINERALS) tablet Take 1 tablet by mouth daily.    Historical Provider, MD  oseltamivir (TAMIFLU) 75 MG capsule Take 1 capsule (75 mg total) by mouth every 12 (twelve) hours. 06/03/16   Kandra Nicolas, MD    Family History Family History  Problem Relation Age of Onset  . Cancer Mother     breast CA    Social History Social History  Substance Use Topics  . Smoking status: Never Smoker  . Smokeless tobacco: Never Used  . Alcohol use No     Allergies   Augmentin [amoxicillin-pot clavulanate]; Cheese; Ciprofloxacin; and Tetracycline hcl   Review of Systems Review of Systems + sore throat + cough No pleuritic pain No wheezing + nasal congestion + post-nasal drainage No sinus pain/pressure No itchy/red eyes No earache No hemoptysis No SOB No fever, + chills No nausea No vomiting No abdominal pain No diarrhea No urinary symptoms No skin rash + fatigue + myalgias + headache Used OTC meds without relief   Physical Exam Triage Vital Signs ED Triage Vitals  Enc Vitals Group     BP 06/03/16 0855  109/68     Pulse Rate 06/03/16 0855 94     Resp 06/03/16 0855 16     Temp 06/03/16 0855 98.1 F (36.7 C)     Temp Source 06/03/16 0855 Oral     SpO2 06/03/16 0855 98 %     Weight 06/03/16 0856 175 lb (79.4 kg)     Height 06/03/16 0856 5\' 8"  (1.727 m)     Head Circumference --      Peak Flow --      Pain Score 06/03/16 0858 2     Pain Loc --      Pain Edu? --      Excl. in [place]? --    No data found.   Updated Vital Signs BP 109/68 (BP Location: Left Arm)   Pulse 94   Temp 98.1 F (36.7 C) (Oral)   Resp 16   Ht 5\' 8"  (1.727 m)   Wt 175 lb (79.4 kg)   LMP 05/29/2016   SpO2 98%   BMI 26.61 kg/m   Visual Acuity Right Eye Distance:   Left Eye Distance:   Bilateral Distance:    Right Eye Near:   Left  Eye Near:    Bilateral Near:     Physical Exam Nursing notes and Vital Signs reviewed. Appearance:  Patient appears stated age, and in no acute distress Eyes:  Pupils are equal, round, and reactive to light and accomodation.  Extraocular movement is intact.  Conjunctivae are not inflamed  Ears:  Canals normal.  Tympanic membranes normal.  Nose:  Mildly congested turbinates.  No sinus tenderness.   Pharynx:  Normal Neck:  Supple.  Tender enlarged posterior/lateral nodes are palpated bilaterally  Lungs:  Clear to auscultation.  Breath sounds are equal.  Moving air well. Heart:  Regular rate and rhythm without murmurs, rubs, or gallops.  Abdomen:  Nontender without masses or hepatosplenomegaly.  Bowel sounds are present.  No CVA or flank tenderness.  Extremities:  No edema.  Skin:  No rash present.    UC Treatments / Results  Labs (all labs ordered are listed, but only abnormal results are displayed) Labs Reviewed - No data to display  EKG  EKG Interpretation None       Radiology Dg Chest 2 View  Result Date: 06/03/2016 CLINICAL DATA:  Productive cough. EXAM: CHEST  2 VIEW COMPARISON:  None. FINDINGS: Patchy opacity in the right upper lobe. Lungs otherwise clear. Heart is normal size. No effusions. No acute bony abnormality. IMPRESSION: Patchy opacity in the right upper lobe concerning for pneumonia. Followup PA and lateral chest X-ray is recommended in 3-4 weeks following trial of antibiotic therapy to ensure resolution and exclude underlying malignancy. Electronically Signed   By: Rolm Baptise M.D.   On: 06/03/2016 10:24    Procedures Procedures (including critical care time)  Medications Ordered in UC Medications - No data to display   Initial Impression / Assessment and Plan / UC Course  I have reviewed the triage vital signs and the nursing notes.  Pertinent labs & imaging results that were available during my care of the patient were reviewed by me and considered in my  medical decision making (see chart for details).    Begin Z-pak. Prescription written for Benzonatate [facility]) to take at bedtime for night-time cough.  Stay well-hydrated. May add Pseudoephedrine (30mg , one or two every 4 to 6 hours) for sinus congestion.  Get adequate rest.   Stop all antihistamines for now, and other non-prescription  cough/cold preparations. Followup with PCP upon returning home for [place] for repeat chest x-ray.   If flu symptoms develop while in [place], begin Tamiflu (Rx written)    Final Clinical Impressions(s) / UC Diagnoses   Final diagnoses:  Pneumonia of right upper lobe due to infectious organism [facility])    New Prescriptions Discharge Medication List as of 06/03/2016 10:54 AM    START taking these medications   Details  azithromycin (ZITHROMAX Z-PAK) 250 MG tablet Take 2 tabs today; then begin one tab once daily for 4 more days., Print    benzonatate (TESSALON) 200 MG capsule Take one cap by mouth at bedtime as needed for cough.  May repeat in 4 to 6 hours, Print    oseltamivir (TAMIFLU) 75 MG capsule Take 1 capsule (75 mg total) by mouth every 12 (twelve) hours., Starting Thu 06/03/2016, Print         Kandra Nicolas, MD 06/06/16 1341

## 2016-06-03 NOTE — ED Triage Notes (Signed)
Patient reports URIs since 04/28/16; treated once by [facility] with doxycycline and noticed improvement but now has return of symptoms.

## 2016-06-03 NOTE — Discharge Instructions (Signed)
Stay well-hydrated. May add Pseudoephedrine (30mg , one or two every 4 to 6 hours) for sinus congestion.  Get adequate rest.   Stop all antihistamines for now, and other non-prescription cough/cold preparations.   If flu symptoms develop while in [place], begin Tamiflu.

## 2016-06-30 ENCOUNTER — Telehealth: Payer: Self-pay | Admitting: Emergency Medicine

## 2016-06-30 NOTE — Telephone Encounter (Signed)
Patient called asking if she should return for follow up on pneumonia from 06/03/16 visit since it was mentioned she might need another chest xray to determine completion of healing. She is feeling fine in all ways. I suggested she appt.with her pcp for follow up if she had any symptoms of concern.

## 2016-07-29 DIAGNOSIS — R05 Cough: Secondary | ICD-10-CM | POA: Diagnosis not present

## 2016-07-29 DIAGNOSIS — J069 Acute upper respiratory infection, unspecified: Secondary | ICD-10-CM | POA: Diagnosis not present

## 2016-07-29 MED FILL — FLUTICASONE PROP 50 MCG SPR: 50 | 30 days supply | Qty: 16 | Fill #0

## 2016-08-04 DIAGNOSIS — R05 Cough: Secondary | ICD-10-CM | POA: Diagnosis not present

## 2016-08-04 MED FILL — AZITHROMYCIN 250 MG TABLET: 250 | 5 days supply | Qty: 6 | Fill #0

## 2016-08-04 MED FILL — IBUPROFEN 800 MG TABLET: 800 | 20 days supply | Qty: 60 | Fill #1

## 2016-10-07 DIAGNOSIS — M25511 Pain in right shoulder: Secondary | ICD-10-CM | POA: Diagnosis not present

## 2016-10-07 MED FILL — CYCLOBENZAPRINE 10 MG TAB: 10 | 30 days supply | Qty: 30 | Fill #0

## 2016-10-28 DIAGNOSIS — Z0000 Encounter for general adult medical examination without abnormal findings: Secondary | ICD-10-CM | POA: Diagnosis not present

## 2016-11-03 DIAGNOSIS — Z0000 Encounter for general adult medical examination without abnormal findings: Secondary | ICD-10-CM | POA: Diagnosis not present

## 2016-11-03 DIAGNOSIS — E785 Hyperlipidemia, unspecified: Secondary | ICD-10-CM | POA: Diagnosis not present

## 2016-11-03 MED FILL — ATORVASTATIN 20 MG TABLET: 20 | 90 days supply | Qty: 90 | Fill #0

## 2016-11-15 DIAGNOSIS — M7541 Impingement syndrome of right shoulder: Secondary | ICD-10-CM | POA: Diagnosis not present

## 2016-11-25 DIAGNOSIS — Z1231 Encounter for screening mammogram for malignant neoplasm of breast: Secondary | ICD-10-CM | POA: Diagnosis not present

## 2016-11-25 DIAGNOSIS — R922 Inconclusive mammogram: Secondary | ICD-10-CM | POA: Diagnosis not present

## 2016-11-25 DIAGNOSIS — H524 Presbyopia: Secondary | ICD-10-CM | POA: Diagnosis not present

## 2016-11-30 DIAGNOSIS — Z01419 Encounter for gynecological examination (general) (routine) without abnormal findings: Secondary | ICD-10-CM | POA: Diagnosis not present

## 2016-11-30 DIAGNOSIS — Z124 Encounter for screening for malignant neoplasm of cervix: Secondary | ICD-10-CM | POA: Diagnosis not present

## 2016-12-13 DIAGNOSIS — R928 Other abnormal and inconclusive findings on diagnostic imaging of breast: Secondary | ICD-10-CM | POA: Diagnosis not present

## 2017-01-05 MED FILL — FLUTICASONE PROP 50 MCG SPR: 50 | 30 days supply | Qty: 16 | Fill #1

## 2017-01-05 MED FILL — IBUPROFEN 800 MG TAB: 800 | 20 days supply | Qty: 60 | Fill #0

## 2017-03-10 MED FILL — ATORVASTATIN 20 MG TABLET: 20 | 90 days supply | Qty: 90 | Fill #1

## 2017-06-06 MED FILL — ATORVASTATIN 20 MG TABLET: 20 | 90 days supply | Qty: 90 | Fill #2

## 2017-06-06 MED FILL — CYCLOBENZAPRINE 10 MG TAB: 10 | 30 days supply | Qty: 30 | Fill #0

## 2017-06-13 DIAGNOSIS — R928 Other abnormal and inconclusive findings on diagnostic imaging of breast: Secondary | ICD-10-CM | POA: Diagnosis not present

## 2017-06-13 DIAGNOSIS — Z1231 Encounter for screening mammogram for malignant neoplasm of breast: Secondary | ICD-10-CM | POA: Diagnosis not present

## 2017-08-04 MED FILL — TEMAZEPAM 15 MG CAPSULE: 15 | 30 days supply | Qty: 60 | Fill #0

## 2017-08-05 MED FILL — FLUTICASONE PROP 50 MCG SPR: 50 | 16 days supply | Qty: 16 | Fill #0

## 2017-08-24 IMAGING — DX DG CHEST 2V
2 series · 2 of 2 positions shown · non-contrast
Comparison: None.

CLINICAL DATA: Productive cough.

EXAM:
CHEST  2 VIEW

[chest pa]
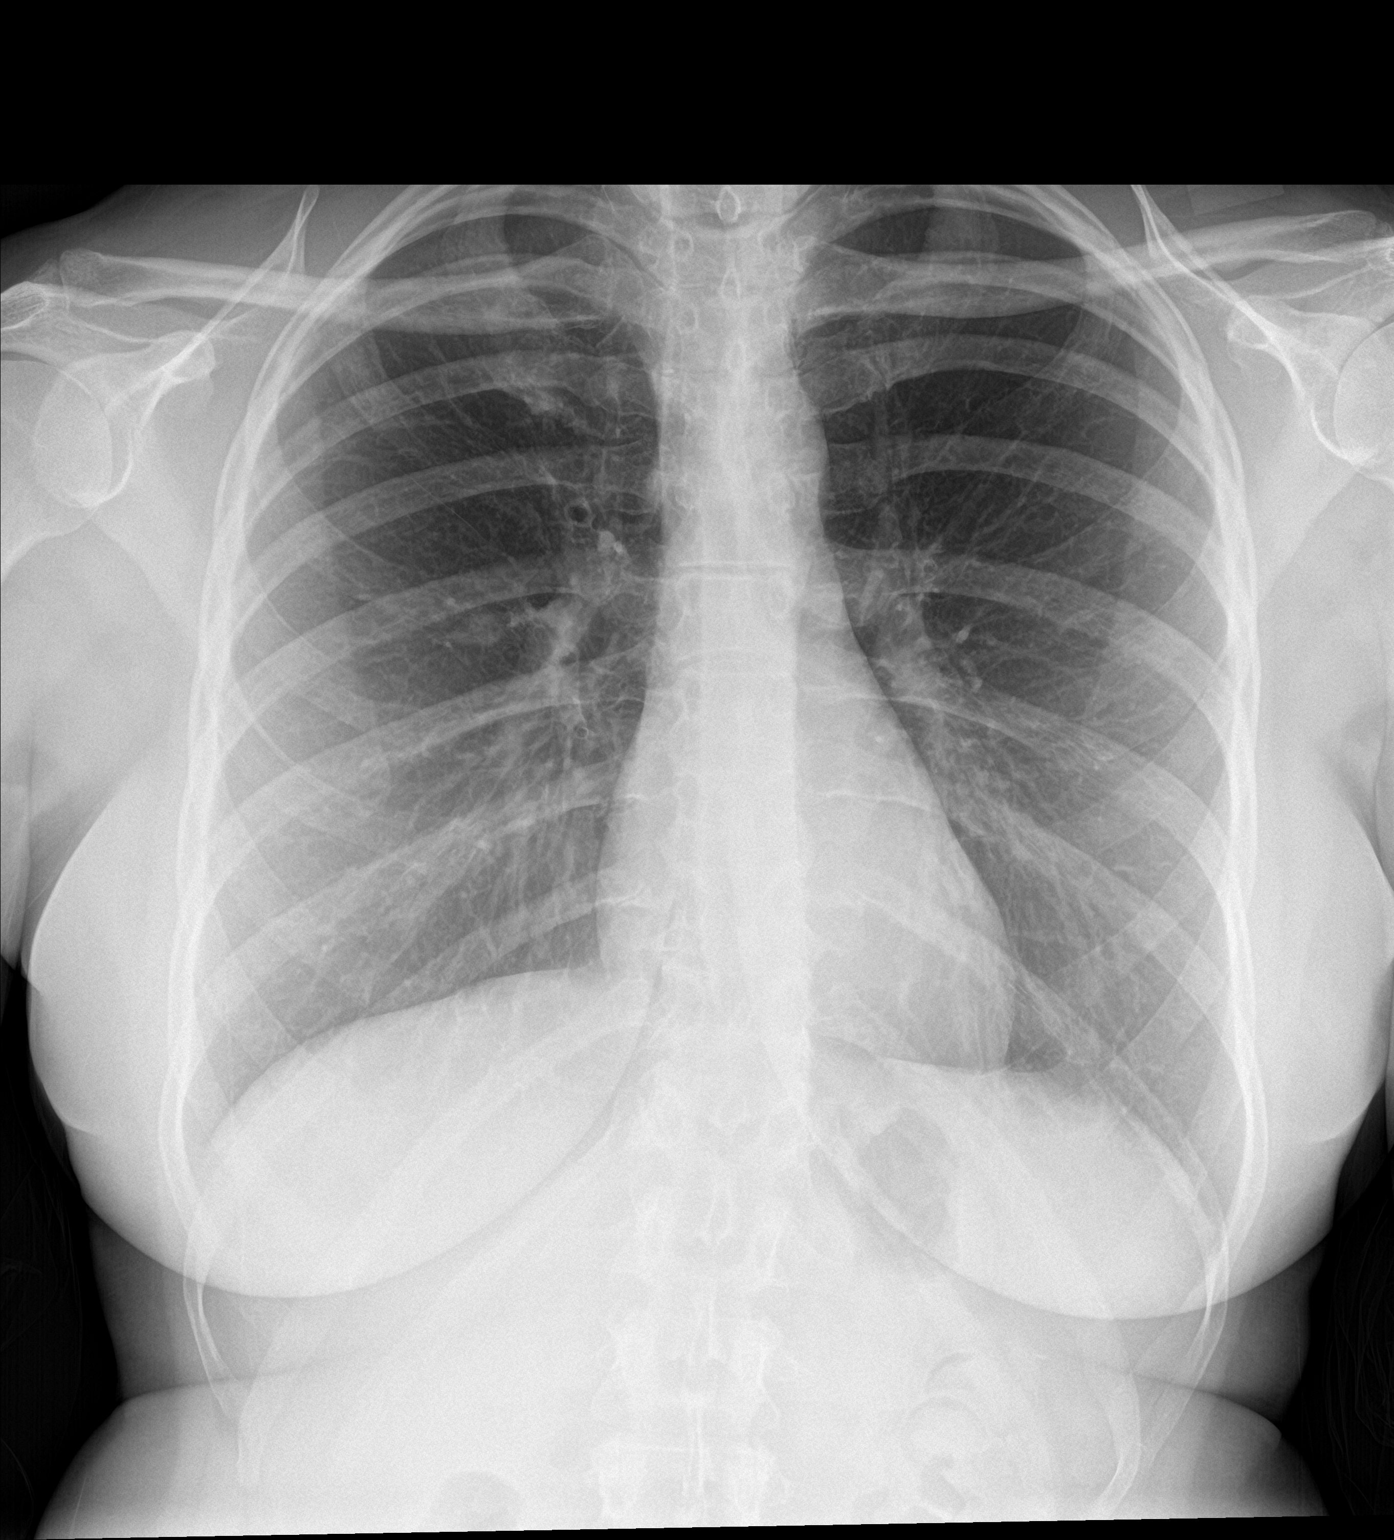

[chest lat]
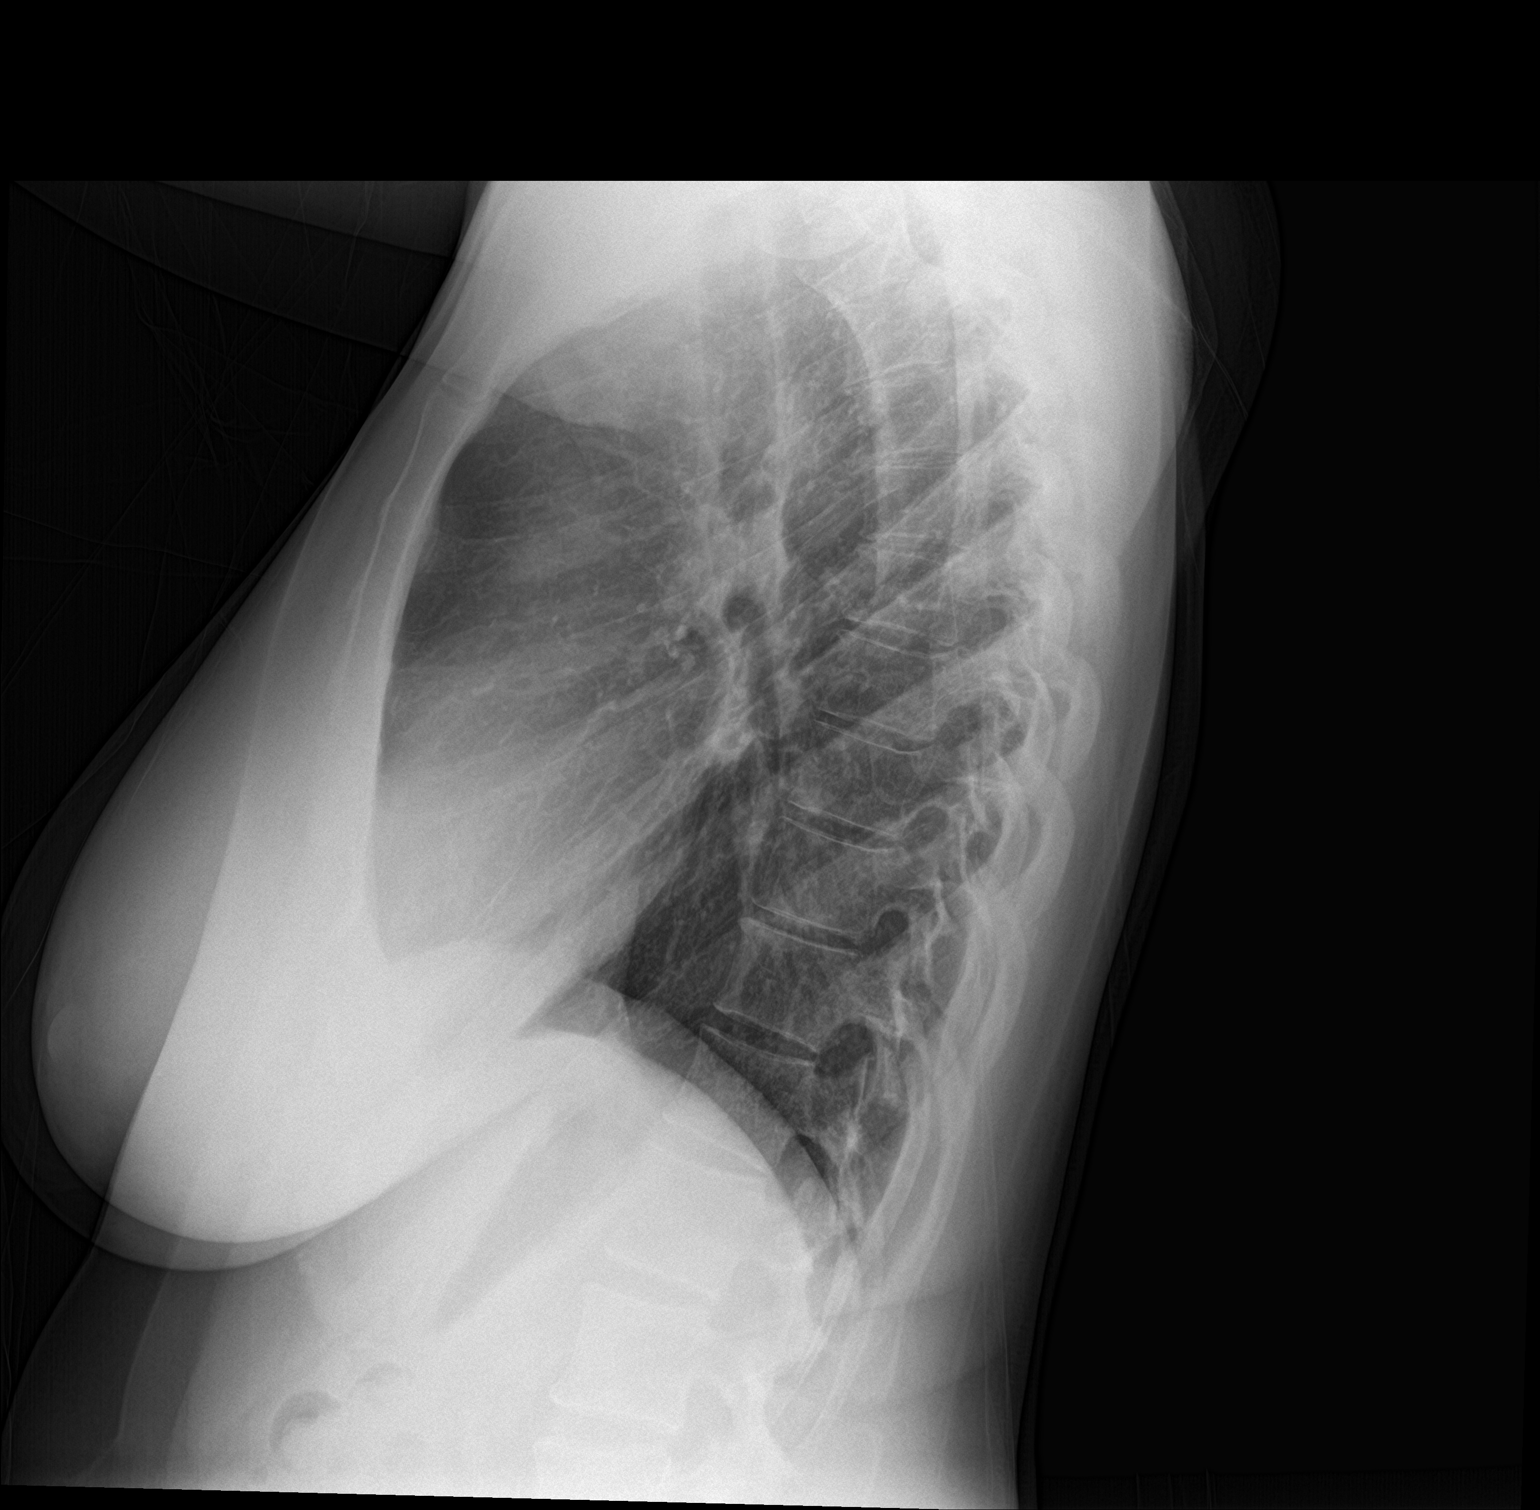

[2 of 2 positions shown; findings below may reference images not displayed]

FINDINGS: Patchy opacity in the right upper lobe. Lungs otherwise clear. Heart
is normal size. No effusions. No acute bony abnormality.
IMPRESSION: Patchy opacity in the right upper lobe concerning for pneumonia.
Followup PA and lateral chest X-ray is recommended in 3-4 weeks
following trial of antibiotic therapy to ensure resolution and
exclude underlying malignancy.

## 2017-10-31 DIAGNOSIS — Z0000 Encounter for general adult medical examination without abnormal findings: Secondary | ICD-10-CM | POA: Diagnosis not present

## 2017-11-01 MED FILL — IBUPROFEN 800 MG TAB: 800 | 20 days supply | Qty: 60 | Fill #1

## 2017-11-01 MED FILL — ATORVASTATIN CALCIUM 20 MG: 20 | 90 days supply | Qty: 90 | Fill #3

## 2017-11-01 MED FILL — CYCLOBENZAPRINE 10 MG TAB: 10 | 30 days supply | Qty: 30 | Fill #0

## 2017-11-08 DIAGNOSIS — E785 Hyperlipidemia, unspecified: Secondary | ICD-10-CM | POA: Diagnosis not present

## 2017-11-08 DIAGNOSIS — Z0000 Encounter for general adult medical examination without abnormal findings: Secondary | ICD-10-CM | POA: Diagnosis not present

## 2017-12-01 DIAGNOSIS — H524 Presbyopia: Secondary | ICD-10-CM | POA: Diagnosis not present

## 2017-12-02 DIAGNOSIS — Z01419 Encounter for gynecological examination (general) (routine) without abnormal findings: Secondary | ICD-10-CM | POA: Diagnosis not present

## 2017-12-02 DIAGNOSIS — Z124 Encounter for screening for malignant neoplasm of cervix: Secondary | ICD-10-CM | POA: Diagnosis not present

## 2018-01-30 MED FILL — ATORVASTATIN CALCIUM 20 MG: 20 | 90 days supply | Qty: 90 | Fill #0

## 2018-01-31 DIAGNOSIS — K5900 Constipation, unspecified: Secondary | ICD-10-CM | POA: Diagnosis not present

## 2018-01-31 DIAGNOSIS — R1030 Lower abdominal pain, unspecified: Secondary | ICD-10-CM | POA: Diagnosis not present

## 2018-02-08 MED FILL — ATORVASTATIN CALCIUM 20 MG: 20 | 90 days supply | Qty: 90 | Fill #0

## 2018-03-10 MED FILL — FLUTICASONE PROP 50 MCG SPR: 50 | 30 days supply | Qty: 16 | Fill #1

## 2018-05-29 ENCOUNTER — Emergency Department
Admission: EM | Admit: 2018-05-29 | Discharge: 2018-05-29 | Disposition: A | Discharge status: Home / Self Care | Payer: 59 | Source: Home / Self Care | Attending: Family Medicine | Admitting: Family Medicine

## 2018-05-29 ENCOUNTER — Other Ambulatory Visit: Payer: Self-pay

## 2018-05-29 DIAGNOSIS — K5900 Constipation, unspecified: Secondary | ICD-10-CM

## 2018-05-29 DIAGNOSIS — D259 Leiomyoma of uterus, unspecified: Secondary | ICD-10-CM | POA: Diagnosis not present

## 2018-05-29 DIAGNOSIS — M549 Dorsalgia, unspecified: Secondary | ICD-10-CM | POA: Diagnosis not present

## 2018-05-29 NOTE — ED Provider Notes (Signed)
[facility]    CSN: [id number] Arrival date & time: 05/29/18  1407     History   Chief Complaint Chief Complaint  Patient presents with  . Back Pain    shoulder blade    HPI Sarah Mendez is a 53 y.o. female.   HPI Sarah Mendez is a 54 y.o. female presenting to UC with c/o severe sudden onset pain between her shoulders that started last night after eating fried shrimp.  Pt felt the urge to belch but no relief after belching once. She took 6 Tums but still no relief of discomfort.  Pain lasts for a few seconds at a time, takes her breath away.  Last episode was around 1:30PM. She called a [facility] line who recommended she be evaluated at an emergency department or an urgent care.  Pt has had intermittent abdominal pain along with constipation for several weeks but denies abdominal pain at this time. She has not had anything to eat or drink today.  She has a CT abd w/ contrast scheduled for this evening at [facility] with [facility] ordered by her PCP.   Pt denies hx of back pain, heavy lifting or recent injury.    Past Medical History:  Diagnosis Date  . Complication of anesthesia    :hard to wake"  . Elevated cholesterol     Patient Active Problem List   Diagnosis Date Noted  . URI 09/19/2009    Past Surgical History:  Procedure Laterality Date  . carpel      carpel boss excision  . laparoscopies     x2  . ROBOT ASSISTED MYOMECTOMY  01/13/2011   Procedure: ROBOTIC ASSISTED MYOMECTOMY;  Surgeon: Daria Pastures;  Location: [facility];  Service: Gynecology;  Laterality: N/A;  with Chromopertubation;     OB History   No obstetric history on file.      Home Medications    Prior to Admission medications   Medication Sig Start Date End Date Taking? Authorizing Provider  atorvastatin (LIPITOR) 20 MG tablet TAKE 1 TABLET BY MOUTH DAILY. 01/30/18  Yes [provider]  azithromycin (ZITHROMAX Z-PAK) 250 MG tablet Take 2 tabs today; then begin  one tab once daily for 4 more days. 06/03/16   Kandra Nicolas, MD  benzonatate (TESSALON) 200 MG capsule Take one cap by mouth at bedtime as needed for cough.  May repeat in 4 to 6 hours 06/03/16   Kandra Nicolas, MD  ibuprofen (ADVIL,MOTRIN) 800 MG tablet Take 800 mg by mouth every 8 (eight) hours as needed.    [provider]  Multiple Vitamins-Minerals (MULTIVITAMIN WITH MINERALS) tablet Take 1 tablet by mouth daily.    [provider]  oseltamivir (TAMIFLU) 75 MG capsule Take 1 capsule (75 mg total) by mouth every 12 (twelve) hours. 06/03/16   Kandra Nicolas, MD  simvastatin (ZOCOR) 10 MG tablet Take 10 mg by mouth daily.    [provider]  traMADol (ULTRAM) 50 MG tablet Take 50 mg by mouth every 6 (six) hours as needed (for headaches).    [provider]    Family History Family History  Problem Relation Age of Onset  . Cancer Mother        breast CA    Social History Social History   Tobacco Use  . Smoking status: Never Smoker  . Smokeless tobacco: Never Used  Substance Use Topics  . Alcohol use: No  . Drug use: No  Allergies   Augmentin [amoxicillin-pot clavulanate]; Cheese; Ciprofloxacin; and Tetracycline hcl   Review of Systems Review of Systems  Constitutional: Positive for fatigue. Negative for chills, diaphoresis and fever.  Respiratory: Negative for cough and shortness of breath.   Cardiovascular: Negative for chest pain and palpitations.  Gastrointestinal: Positive for constipation. Negative for abdominal pain, diarrhea, nausea and vomiting.  Musculoskeletal: Positive for back pain. Negative for myalgias.     Physical Exam Triage Vital Signs ED Triage Vitals  Enc Vitals Group     BP 05/29/18 1448 126/79     Pulse Rate 05/29/18 1448 71     Resp 05/29/18 1448 20     Temp 05/29/18 1448 97.7 F (36.5 C)     Temp Source 05/29/18 1448 Oral     SpO2 05/29/18 1448 100 %     Weight 05/29/18 1449 169 lb (76.7 kg)      Height 05/29/18 1449 5\' 8"  (1.727 m)     Head Circumference --      Peak Flow --      Pain Score 05/29/18 1448 4     Pain Loc --      Pain Edu? --      Excl. in [place]? --    No data found.  Updated Vital Signs BP 126/79 (BP Location: Right Arm)   Pulse 71   Temp 97.7 F (36.5 C) (Oral)   Resp 20   Ht 5\' 8"  (1.727 m)   Wt 169 lb (76.7 kg)   SpO2 100%   BMI 25.70 kg/m   Visual Acuity Right Eye Distance:   Left Eye Distance:   Bilateral Distance:    Right Eye Near:   Left Eye Near:    Bilateral Near:     Physical Exam Vitals signs and nursing note reviewed.  Constitutional:      Appearance: Normal appearance. She is well-developed.  HENT:     Head: Normocephalic and atraumatic.  Neck:     Musculoskeletal: Normal range of motion.  Cardiovascular:     Rate and Rhythm: Normal rate and regular rhythm.  Pulmonary:     Effort: Pulmonary effort is normal.     Breath sounds: Normal breath sounds.  Abdominal:     General: There is no distension.     Palpations: Abdomen is soft.     Tenderness: There is no abdominal tenderness. There is no right CVA tenderness or left CVA tenderness.  Musculoskeletal: Normal range of motion.        General: No tenderness or deformity.  Skin:    General: Skin is warm and dry.  Neurological:     Mental Status: She is alert and oriented to person, place, and time.  Psychiatric:        Behavior: Behavior normal.      UC Treatments / Results  Labs (all labs ordered are listed, but only abnormal results are displayed) Labs Reviewed - No data to display  EKG Date/Time:05/29/2018    14:37:38 Ventricular Rate: 71 PR Interval: 178 QRS Duration: 80 QT Interval: 366 QTC Calculation: 397 P-R-T axes: 63   54   30 Text Interpretation: Normal sinus rhythm. Normal ECG   No old to prior to compare.   Radiology No results found.  Procedures Procedures (including critical care time)  Medications Ordered in UC Medications - No  data to display  Initial Impression / Assessment and Plan / UC Course  I have reviewed the triage vital signs and the nursing notes.  Pertinent labs & imaging results that were available during my care of the patient were reviewed by me and considered in my medical decision making (see chart for details).    Reassured pt of normal EKG Abdominal exam- benign  Reviewed PMH- last set of labs was in June 2019.  Recommended labs and U/S gallbladder to r/o cholecystitis, pain possibly referred given no back injury and normal EKG.   Pt declined. Pt has CT abdomen w/ contrast ordered for 6PM this evening by her PCP with [facility]. Encouraged pt to keep imaging appointment. Discussed symptoms that warrant emergent care in the ED.  Final Clinical Impressions(s) / UC Diagnoses   Final diagnoses:  Upper back pain  Constipation, unspecified constipation type     Discharge Instructions      An ultrasound and labs were suggested to help determine the cause of your sudden onset of severe pain, which you declined.  The pain could be referred from your abdomen to your back, or the pain could be due to muscle spasms.  Your EKG was normal today, which is reassuring the pain is not coming from your heart.  It is still recommended you get the CT scan of your abdomen this evening as scheduled by your family doctor. Please call your family doctor tomorrow to schedule a recheck of your symptoms. They may want to order more tests depending on the CT scan this evening.  Call 911 or go to the closest hospital if symptoms worsening- worsening pain, trouble breathing, new symptoms- chest pain or abdominal pain, vomiting, fever >100.4*F.     ED Prescriptions    None     Controlled Substance Prescriptions [place] Controlled Substance Registry consulted? Not Applicable   Tyrell Antonio 05/29/18 1547

## 2018-05-29 NOTE — ED Triage Notes (Signed)
Pt Stated that pain started last night around 11 pm after eating fried shrimp.  When it is at its worse, it is hard to breath and move.  Hurting a 4 right now.  Denies heart issues, jaw pain, or pain in the arms.

## 2018-05-29 NOTE — Discharge Instructions (Signed)
°  An ultrasound and labs were suggested to help determine the cause of your sudden onset of severe pain, which you declined.  The pain could be referred from your abdomen to your back, or the pain could be due to muscle spasms.  Your EKG was normal today, which is reassuring the pain is not coming from your heart.  It is still recommended you get the CT scan of your abdomen this evening as scheduled by your family doctor. Please call your family doctor tomorrow to schedule a recheck of your symptoms. They may want to order more tests depending on the CT scan this evening.  Call 911 or go to the closest hospital if symptoms worsening- worsening pain, trouble breathing, new symptoms- chest pain or abdominal pain, vomiting, fever >100.4*F.

## 2018-05-31 DIAGNOSIS — Q445 Other congenital malformations of bile ducts: Secondary | ICD-10-CM | POA: Diagnosis not present

## 2018-05-31 DIAGNOSIS — K5900 Constipation, unspecified: Secondary | ICD-10-CM | POA: Diagnosis not present

## 2018-05-31 DIAGNOSIS — R1030 Lower abdominal pain, unspecified: Secondary | ICD-10-CM | POA: Diagnosis not present

## 2018-05-31 DIAGNOSIS — R1011 Right upper quadrant pain: Secondary | ICD-10-CM | POA: Diagnosis not present

## 2018-06-05 MED FILL — PANTOPRAZOLE SOD DR 40 MG T: 40 | 90 days supply | Qty: 90 | Fill #0

## 2018-06-06 DIAGNOSIS — Q445 Other congenital malformations of bile ducts: Secondary | ICD-10-CM | POA: Diagnosis not present

## 2018-06-06 DIAGNOSIS — R1011 Right upper quadrant pain: Secondary | ICD-10-CM | POA: Diagnosis not present

## 2018-06-19 DIAGNOSIS — Z1239 Encounter for other screening for malignant neoplasm of breast: Secondary | ICD-10-CM | POA: Diagnosis not present

## 2018-06-19 DIAGNOSIS — Z1231 Encounter for screening mammogram for malignant neoplasm of breast: Secondary | ICD-10-CM | POA: Diagnosis not present

## 2018-06-23 DIAGNOSIS — R109 Unspecified abdominal pain: Secondary | ICD-10-CM | POA: Diagnosis not present

## 2018-06-23 DIAGNOSIS — K838 Other specified diseases of biliary tract: Secondary | ICD-10-CM | POA: Diagnosis not present

## 2018-06-23 DIAGNOSIS — K831 Obstruction of bile duct: Secondary | ICD-10-CM | POA: Diagnosis not present

## 2018-07-17 DIAGNOSIS — R928 Other abnormal and inconclusive findings on diagnostic imaging of breast: Secondary | ICD-10-CM | POA: Diagnosis not present

## 2018-07-17 DIAGNOSIS — N6321 Unspecified lump in the left breast, upper outer quadrant: Secondary | ICD-10-CM | POA: Diagnosis not present

## 2018-07-27 MED FILL — IBUPROFEN 800 MG TAB: 800 | 20 days supply | Qty: 60 | Fill #0

## 2018-07-27 MED FILL — ATORVASTATIN 20 MG TABLET: 20 | 90 days supply | Qty: 90 | Fill #1

## 2018-10-17 MED FILL — CYCLOBENZAPRINE HCL 10 MG T: 10 | 30 days supply | Qty: 30 | Fill #0

## 2018-10-17 MED FILL — TEMAZEPAM 15 MG CAPSULE: 15 | 30 days supply | Qty: 60 | Fill #0

## 2018-11-27 DIAGNOSIS — Z0000 Encounter for general adult medical examination without abnormal findings: Secondary | ICD-10-CM | POA: Diagnosis not present

## 2018-11-28 MED FILL — HYDROCODON-APAP 5-325: 5-325 | 1 days supply | Qty: 10 | Fill #0

## 2018-11-29 MED FILL — ATORVASTATIN 20 MG TABLET: 20 | 90 days supply | Qty: 90 | Fill #2

## 2018-12-04 DIAGNOSIS — H524 Presbyopia: Secondary | ICD-10-CM | POA: Diagnosis not present

## 2018-12-05 DIAGNOSIS — E785 Hyperlipidemia, unspecified: Secondary | ICD-10-CM | POA: Diagnosis not present

## 2018-12-05 DIAGNOSIS — Z0000 Encounter for general adult medical examination without abnormal findings: Secondary | ICD-10-CM | POA: Diagnosis not present

## 2018-12-05 MED FILL — IBUPROFEN 800 MG TAB: 800 | 7 days supply | Qty: 30 | Fill #0

## 2018-12-05 MED FILL — FLUTICASONE PROP 50 MCG SPR: 50 | 30 days supply | Qty: 16 | Fill #0

## 2018-12-05 MED FILL — CYCLOBENZAPRINE HCL 10 MG T: 10 | 30 days supply | Qty: 30 | Fill #0

## 2018-12-07 DIAGNOSIS — Z01419 Encounter for gynecological examination (general) (routine) without abnormal findings: Secondary | ICD-10-CM | POA: Diagnosis not present

## 2018-12-07 DIAGNOSIS — Z124 Encounter for screening for malignant neoplasm of cervix: Secondary | ICD-10-CM | POA: Diagnosis not present

## 2019-01-16 MED FILL — FLUTICASONE PROP 50 MCG SPR: 50 | 30 days supply | Qty: 16 | Fill #0

## 2019-01-17 MED FILL — IBUPROFEN 800 MG TAB: 800 | 20 days supply | Qty: 60 | Fill #1

## 2019-03-26 MED FILL — ATORVASTATIN 20 MG TABLET: 20 | 90 days supply | Qty: 90 | Fill #0

## 2019-05-08 DIAGNOSIS — J019 Acute sinusitis, unspecified: Secondary | ICD-10-CM | POA: Diagnosis not present

## 2019-05-08 MED FILL — predniSONE 10 MG (21) TBPK: 10 | 6 days supply | Qty: 21 | Fill #0

## 2019-05-08 MED FILL — AZITHROMYCIN 250 MG TABLET: 250 | 5 days supply | Qty: 6 | Fill #0

## 2019-06-26 MED FILL — ATORVASTATIN 20 MG TABLET: 20 | 90 days supply | Qty: 90 | Fill #1

## 2019-06-26 MED FILL — CYCLOBENZAPRINE HCL 10 MG T: 10 | 30 days supply | Qty: 30 | Fill #0

## 2019-06-26 MED FILL — TEMAZEPAM 15 MG CAPSULE: 15 | 30 days supply | Qty: 60 | Fill #0

## 2019-07-02 DIAGNOSIS — Z87898 Personal history of other specified conditions: Secondary | ICD-10-CM | POA: Diagnosis not present

## 2019-07-02 DIAGNOSIS — Z803 Family history of malignant neoplasm of breast: Secondary | ICD-10-CM | POA: Diagnosis not present

## 2019-07-02 MED FILL — FLUTICASONE PROP 50 MCG SPR: 50 | 30 days supply | Qty: 16 | Fill #1

## 2019-07-24 DIAGNOSIS — Z1231 Encounter for screening mammogram for malignant neoplasm of breast: Secondary | ICD-10-CM | POA: Diagnosis not present

## 2019-07-24 DIAGNOSIS — N6489 Other specified disorders of breast: Secondary | ICD-10-CM | POA: Diagnosis not present

## 2019-07-24 DIAGNOSIS — Z803 Family history of malignant neoplasm of breast: Secondary | ICD-10-CM | POA: Diagnosis not present

## 2019-08-01 DIAGNOSIS — R918 Other nonspecific abnormal finding of lung field: Secondary | ICD-10-CM | POA: Diagnosis not present

## 2019-08-01 DIAGNOSIS — R911 Solitary pulmonary nodule: Secondary | ICD-10-CM | POA: Diagnosis not present

## 2019-08-06 DIAGNOSIS — N200 Calculus of kidney: Secondary | ICD-10-CM | POA: Diagnosis not present

## 2019-08-06 DIAGNOSIS — R911 Solitary pulmonary nodule: Secondary | ICD-10-CM | POA: Diagnosis not present

## 2019-08-06 DIAGNOSIS — I700 Atherosclerosis of aorta: Secondary | ICD-10-CM | POA: Diagnosis not present

## 2019-08-10 DIAGNOSIS — Z803 Family history of malignant neoplasm of breast: Secondary | ICD-10-CM | POA: Diagnosis not present

## 2019-08-10 DIAGNOSIS — R918 Other nonspecific abnormal finding of lung field: Secondary | ICD-10-CM | POA: Diagnosis not present

## 2019-09-03 MED FILL — PANTOPRAZOLE SOD DR 40 MG T: 40 | 90 days supply | Qty: 90 | Fill #0

## 2019-10-22 MED FILL — ATORVASTATIN 20 MG TABLET: 20 | 90 days supply | Qty: 90 | Fill #2

## 2019-11-02 DIAGNOSIS — R918 Other nonspecific abnormal finding of lung field: Secondary | ICD-10-CM | POA: Diagnosis not present

## 2019-12-03 DIAGNOSIS — H2513 Age-related nuclear cataract, bilateral: Secondary | ICD-10-CM | POA: Diagnosis not present

## 2019-12-03 DIAGNOSIS — H524 Presbyopia: Secondary | ICD-10-CM | POA: Diagnosis not present

## 2019-12-06 MED FILL — PANTOPRAZOLE SOD DR 40 MG T: 40 | 90 days supply | Qty: 90 | Fill #1

## 2019-12-07 DIAGNOSIS — Z0000 Encounter for general adult medical examination without abnormal findings: Secondary | ICD-10-CM | POA: Diagnosis not present

## 2019-12-10 ENCOUNTER — Other Ambulatory Visit (HOSPITAL_COMMUNITY): Payer: Self-pay | Admitting: Pathology

## 2019-12-10 DIAGNOSIS — G8929 Other chronic pain: Secondary | ICD-10-CM | POA: Diagnosis not present

## 2019-12-10 DIAGNOSIS — K219 Gastro-esophageal reflux disease without esophagitis: Secondary | ICD-10-CM | POA: Diagnosis not present

## 2019-12-10 DIAGNOSIS — G4700 Insomnia, unspecified: Secondary | ICD-10-CM | POA: Diagnosis not present

## 2019-12-10 DIAGNOSIS — E785 Hyperlipidemia, unspecified: Secondary | ICD-10-CM | POA: Diagnosis not present

## 2019-12-10 DIAGNOSIS — E559 Vitamin D deficiency, unspecified: Secondary | ICD-10-CM | POA: Diagnosis not present

## 2019-12-10 DIAGNOSIS — M25512 Pain in left shoulder: Secondary | ICD-10-CM | POA: Diagnosis not present

## 2019-12-10 DIAGNOSIS — Z0000 Encounter for general adult medical examination without abnormal findings: Secondary | ICD-10-CM | POA: Diagnosis not present

## 2019-12-10 MED FILL — MELOXICAM 15 MG TABLET: 15 | 30 days supply | Qty: 30 | Fill #0

## 2019-12-19 DIAGNOSIS — M7502 Adhesive capsulitis of left shoulder: Secondary | ICD-10-CM | POA: Diagnosis not present

## 2019-12-19 DIAGNOSIS — M7532 Calcific tendinitis of left shoulder: Secondary | ICD-10-CM | POA: Diagnosis not present

## 2020-01-09 DIAGNOSIS — Z124 Encounter for screening for malignant neoplasm of cervix: Secondary | ICD-10-CM | POA: Diagnosis not present

## 2020-01-09 DIAGNOSIS — Z01419 Encounter for gynecological examination (general) (routine) without abnormal findings: Secondary | ICD-10-CM | POA: Diagnosis not present

## 2020-02-11 MED FILL — MELOXICAM 15 MG TABLET: 15 | 30 days supply | Qty: 30 | Fill #1

## 2020-02-12 ENCOUNTER — Other Ambulatory Visit (HOSPITAL_COMMUNITY): Payer: Self-pay | Admitting: Pathology

## 2020-02-12 MED FILL — ATORVASTATIN CALCIUM 20 MG: 20 | 90 days supply | Qty: 90 | Fill #0

## 2020-02-20 ENCOUNTER — Other Ambulatory Visit (HOSPITAL_COMMUNITY): Payer: Self-pay | Admitting: General Practice

## 2020-02-20 DIAGNOSIS — M7532 Calcific tendinitis of left shoulder: Secondary | ICD-10-CM | POA: Diagnosis not present

## 2020-02-20 DIAGNOSIS — M7502 Adhesive capsulitis of left shoulder: Secondary | ICD-10-CM | POA: Diagnosis not present

## 2020-02-20 MED FILL — DICLOFENAC SODIUM 1 % GEL: 1 | 8 days supply | Qty: 100 | Fill #0

## 2020-03-26 ENCOUNTER — Other Ambulatory Visit (HOSPITAL_COMMUNITY): Payer: Self-pay | Admitting: Pathology

## 2020-03-26 MED FILL — DICLOFENAC SODIUM 1 % GEL: 1 | 8 days supply | Qty: 100 | Fill #1

## 2020-03-26 MED FILL — FLUTICASONE PROP 50 MCG SPR: 50 | 30 days supply | Qty: 16 | Fill #0

## 2020-04-01 ENCOUNTER — Other Ambulatory Visit (HOSPITAL_COMMUNITY): Payer: Self-pay

## 2020-04-01 DIAGNOSIS — Z20822 Contact with and (suspected) exposure to covid-19: Secondary | ICD-10-CM | POA: Diagnosis not present

## 2020-04-01 DIAGNOSIS — H6123 Impacted cerumen, bilateral: Secondary | ICD-10-CM | POA: Diagnosis not present

## 2020-04-01 DIAGNOSIS — R42 Dizziness and giddiness: Secondary | ICD-10-CM | POA: Diagnosis not present

## 2020-04-01 DIAGNOSIS — J069 Acute upper respiratory infection, unspecified: Secondary | ICD-10-CM | POA: Diagnosis not present

## 2020-04-01 DIAGNOSIS — H938X3 Other specified disorders of ear, bilateral: Secondary | ICD-10-CM | POA: Diagnosis not present

## 2020-04-01 DIAGNOSIS — R0981 Nasal congestion: Secondary | ICD-10-CM | POA: Diagnosis not present

## 2020-04-01 MED FILL — MECLIZINE HCL 25 MG TABS: 25 | 10 days supply | Qty: 15 | Fill #0

## 2020-04-01 MED FILL — SM LORATADINE D 12HR 5-120: 5-120 | 20 days supply | Qty: 20 | Fill #0

## 2020-04-11 DIAGNOSIS — R918 Other nonspecific abnormal finding of lung field: Secondary | ICD-10-CM | POA: Diagnosis not present

## 2020-04-11 DIAGNOSIS — Z803 Family history of malignant neoplasm of breast: Secondary | ICD-10-CM | POA: Diagnosis not present

## 2020-04-19 MED FILL — DICLOFENAC SODIUM 1 % GEL: 1 | 8 days supply | Qty: 100 | Fill #2

## 2020-07-18 MED FILL — ATORVASTATIN CALCIUM 20 MG: 20 | 90 days supply | Qty: 90 | Fill #1

## 2020-11-21 ENCOUNTER — Other Ambulatory Visit (HOSPITAL_COMMUNITY): Payer: Self-pay

## 2020-11-21 MED FILL — Atorvastatin Calcium Tab 20 MG (Base Equivalent): ORAL | 90 days supply | Qty: 90 | Fill #0 | Status: AC

## 2020-11-24 ENCOUNTER — Other Ambulatory Visit (HOSPITAL_COMMUNITY): Payer: Self-pay

## 2020-11-25 ENCOUNTER — Other Ambulatory Visit (HOSPITAL_COMMUNITY): Payer: Self-pay

## 2020-11-25 MED ORDER — MELOXICAM 15 MG PO TABS
ORAL_TABLET | ORAL | 1 refills | Status: AC
  Filled 2020-11-25: qty 30, 30d supply, fill #0

## 2020-12-03 ENCOUNTER — Other Ambulatory Visit (HOSPITAL_COMMUNITY): Payer: Self-pay

## 2020-12-11 ENCOUNTER — Other Ambulatory Visit (HOSPITAL_COMMUNITY): Payer: Self-pay

## 2020-12-11 MED ORDER — CYCLOBENZAPRINE HCL 10 MG PO TABS
ORAL_TABLET | ORAL | 11 refills | Status: DC
  Filled 2020-12-11: qty 30, 30d supply, fill #0
  Filled 2021-03-17: qty 30, 30d supply, fill #1
  Filled 2021-08-13: qty 30, 30d supply, fill #2

## 2021-03-17 ENCOUNTER — Other Ambulatory Visit (HOSPITAL_COMMUNITY): Payer: Self-pay

## 2021-03-18 ENCOUNTER — Other Ambulatory Visit (HOSPITAL_COMMUNITY): Payer: Self-pay

## 2021-03-18 MED ORDER — ATORVASTATIN CALCIUM 20 MG PO TABS
20.0000 mg | ORAL_TABLET | Freq: Every day | ORAL | 3 refills | Status: DC
  Filled 2021-03-18: qty 90, 90d supply, fill #0
  Filled 2021-08-13: qty 30, 30d supply, fill #1
  Filled 2021-12-14: qty 30, 30d supply, fill #2
  Filled 2022-01-18 – 2022-02-28 (×2): qty 30, 30d supply, fill #3

## 2021-03-19 ENCOUNTER — Other Ambulatory Visit (HOSPITAL_COMMUNITY): Payer: Self-pay

## 2021-06-12 ENCOUNTER — Other Ambulatory Visit (HOSPITAL_COMMUNITY): Payer: Self-pay

## 2021-07-31 DIAGNOSIS — R928 Other abnormal and inconclusive findings on diagnostic imaging of breast: Secondary | ICD-10-CM | POA: Diagnosis not present

## 2021-07-31 DIAGNOSIS — R918 Other nonspecific abnormal finding of lung field: Secondary | ICD-10-CM | POA: Diagnosis not present

## 2021-07-31 DIAGNOSIS — Z1231 Encounter for screening mammogram for malignant neoplasm of breast: Secondary | ICD-10-CM | POA: Diagnosis not present

## 2021-07-31 DIAGNOSIS — Z87898 Personal history of other specified conditions: Secondary | ICD-10-CM | POA: Diagnosis not present

## 2021-07-31 DIAGNOSIS — Z1239 Encounter for other screening for malignant neoplasm of breast: Secondary | ICD-10-CM | POA: Diagnosis not present

## 2021-07-31 DIAGNOSIS — Z803 Family history of malignant neoplasm of breast: Secondary | ICD-10-CM | POA: Diagnosis not present

## 2021-08-13 ENCOUNTER — Other Ambulatory Visit (HOSPITAL_COMMUNITY): Payer: Self-pay

## 2021-12-14 ENCOUNTER — Other Ambulatory Visit (HOSPITAL_COMMUNITY): Payer: Self-pay

## 2021-12-14 DIAGNOSIS — Z0000 Encounter for general adult medical examination without abnormal findings: Secondary | ICD-10-CM | POA: Diagnosis not present

## 2021-12-14 DIAGNOSIS — Z7184 Encounter for health counseling related to travel: Secondary | ICD-10-CM | POA: Diagnosis not present

## 2021-12-15 ENCOUNTER — Other Ambulatory Visit (HOSPITAL_COMMUNITY): Payer: Self-pay

## 2021-12-15 DIAGNOSIS — Z124 Encounter for screening for malignant neoplasm of cervix: Secondary | ICD-10-CM | POA: Diagnosis not present

## 2021-12-15 MED ORDER — ATOVAQUONE-PROGUANIL HCL 250-100 MG PO TABS
ORAL_TABLET | ORAL | 0 refills | Status: AC
  Filled 2021-12-15: qty 17, 17d supply, fill #0

## 2021-12-15 MED ORDER — CYCLOBENZAPRINE HCL 10 MG PO TABS
ORAL_TABLET | ORAL | 11 refills | Status: DC
  Filled 2021-12-15: qty 30, 30d supply, fill #0
  Filled 2022-07-03: qty 30, 30d supply, fill #1

## 2021-12-15 MED ORDER — AZITHROMYCIN 250 MG PO TABS
ORAL_TABLET | ORAL | 0 refills | Status: AC
  Filled 2021-12-15: qty 12, 1d supply, fill #0

## 2022-01-18 ENCOUNTER — Other Ambulatory Visit (HOSPITAL_COMMUNITY): Payer: Self-pay

## 2022-01-28 ENCOUNTER — Other Ambulatory Visit (HOSPITAL_COMMUNITY): Payer: Self-pay

## 2022-02-19 DIAGNOSIS — Z1239 Encounter for other screening for malignant neoplasm of breast: Secondary | ICD-10-CM | POA: Diagnosis not present

## 2022-02-19 DIAGNOSIS — Z803 Family history of malignant neoplasm of breast: Secondary | ICD-10-CM | POA: Diagnosis not present

## 2022-03-01 ENCOUNTER — Other Ambulatory Visit (HOSPITAL_COMMUNITY): Payer: Self-pay

## 2022-04-17 ENCOUNTER — Other Ambulatory Visit (HOSPITAL_COMMUNITY): Payer: Self-pay

## 2022-04-19 ENCOUNTER — Other Ambulatory Visit (HOSPITAL_COMMUNITY): Payer: Self-pay

## 2022-04-19 MED ORDER — ATORVASTATIN CALCIUM 20 MG PO TABS
20.0000 mg | ORAL_TABLET | Freq: Every day | ORAL | 3 refills | Status: DC
  Filled 2022-04-19: qty 30, 30d supply, fill #0
  Filled 2022-05-13: qty 30, 30d supply, fill #1
  Filled 2022-06-29: qty 30, 30d supply, fill #2
  Filled 2022-07-08: qty 30, 30d supply, fill #3

## 2022-04-20 ENCOUNTER — Other Ambulatory Visit (HOSPITAL_COMMUNITY): Payer: Self-pay

## 2022-06-29 ENCOUNTER — Other Ambulatory Visit (HOSPITAL_COMMUNITY): Payer: Self-pay

## 2022-07-03 ENCOUNTER — Other Ambulatory Visit (HOSPITAL_COMMUNITY): Payer: Self-pay

## 2022-07-08 ENCOUNTER — Other Ambulatory Visit: Payer: Self-pay

## 2022-07-08 ENCOUNTER — Other Ambulatory Visit (HOSPITAL_COMMUNITY): Payer: Self-pay

## 2022-08-23 ENCOUNTER — Other Ambulatory Visit (HOSPITAL_COMMUNITY): Payer: Self-pay

## 2022-08-23 MED ORDER — ATORVASTATIN CALCIUM 20 MG PO TABS
20.0000 mg | ORAL_TABLET | Freq: Every day | ORAL | 3 refills | Status: DC
  Filled 2022-08-23: qty 30, 30d supply, fill #0
  Filled 2022-09-22: qty 30, 30d supply, fill #1
  Filled 2022-11-07: qty 30, 30d supply, fill #2
  Filled 2022-12-07: qty 30, 30d supply, fill #3

## 2022-12-07 ENCOUNTER — Other Ambulatory Visit (HOSPITAL_COMMUNITY): Payer: Self-pay

## 2023-03-03 ENCOUNTER — Other Ambulatory Visit (HOSPITAL_COMMUNITY): Payer: Self-pay

## 2023-03-03 MED ORDER — CYCLOBENZAPRINE HCL 10 MG PO TABS
5.0000 mg | ORAL_TABLET | Freq: Every evening | ORAL | 11 refills | Status: DC | PRN
  Filled 2023-03-03: qty 30, 30d supply, fill #0

## 2023-03-03 MED ORDER — ATORVASTATIN CALCIUM 20 MG PO TABS
20.0000 mg | ORAL_TABLET | Freq: Every day | ORAL | 3 refills | Status: DC
  Filled 2023-03-03: qty 30, 30d supply, fill #0
  Filled 2023-04-11: qty 30, 30d supply, fill #1
  Filled 2023-05-13: qty 30, 30d supply, fill #2
  Filled 2023-06-04 – 2023-06-10 (×2): qty 30, 30d supply, fill #3

## 2023-03-09 ENCOUNTER — Other Ambulatory Visit (HOSPITAL_COMMUNITY): Payer: Self-pay

## 2023-04-14 ENCOUNTER — Other Ambulatory Visit (HOSPITAL_COMMUNITY): Payer: Self-pay

## 2023-05-18 ENCOUNTER — Other Ambulatory Visit (HOSPITAL_COMMUNITY): Payer: Self-pay

## 2023-06-05 ENCOUNTER — Other Ambulatory Visit (HOSPITAL_COMMUNITY): Payer: Self-pay

## 2023-06-10 ENCOUNTER — Other Ambulatory Visit (HOSPITAL_BASED_OUTPATIENT_CLINIC_OR_DEPARTMENT_OTHER): Payer: Self-pay

## 2023-06-10 ENCOUNTER — Other Ambulatory Visit (HOSPITAL_COMMUNITY): Payer: Self-pay

## 2023-07-18 ENCOUNTER — Other Ambulatory Visit (HOSPITAL_COMMUNITY): Payer: Self-pay

## 2023-07-18 MED ORDER — ATORVASTATIN CALCIUM 20 MG PO TABS
20.0000 mg | ORAL_TABLET | Freq: Every day | ORAL | 3 refills | Status: DC
  Filled 2023-07-18: qty 30, 30d supply, fill #0
  Filled 2023-08-12: qty 30, 30d supply, fill #1
  Filled 2023-09-21: qty 30, 30d supply, fill #2
  Filled 2023-10-20: qty 30, 30d supply, fill #3

## 2023-11-15 ENCOUNTER — Other Ambulatory Visit (HOSPITAL_COMMUNITY): Payer: Self-pay

## 2023-11-15 MED ORDER — ATORVASTATIN CALCIUM 20 MG PO TABS
20.0000 mg | ORAL_TABLET | Freq: Every day | ORAL | 3 refills | Status: DC
  Filled 2023-11-15: qty 30, 30d supply, fill #0
  Filled 2023-12-19: qty 30, 30d supply, fill #1
  Filled 2024-02-07: qty 30, 30d supply, fill #2
  Filled 2024-03-07: qty 30, 30d supply, fill #3

## 2024-04-02 ENCOUNTER — Other Ambulatory Visit (HOSPITAL_COMMUNITY): Payer: Self-pay

## 2024-04-04 ENCOUNTER — Other Ambulatory Visit (HOSPITAL_COMMUNITY): Payer: Self-pay

## 2024-04-06 ENCOUNTER — Other Ambulatory Visit (HOSPITAL_COMMUNITY): Payer: Self-pay

## 2024-04-06 MED ORDER — ATORVASTATIN CALCIUM 20 MG PO TABS
20.0000 mg | ORAL_TABLET | Freq: Every day | ORAL | 3 refills | Status: AC
  Filled 2024-04-06 – 2024-04-12 (×3): qty 30, 30d supply, fill #0

## 2024-04-12 ENCOUNTER — Other Ambulatory Visit: Payer: Self-pay

## 2024-04-12 ENCOUNTER — Other Ambulatory Visit (HOSPITAL_COMMUNITY): Payer: Self-pay
